# Patient Record
Sex: Female | Born: 1985 | Race: White | Hispanic: No | Marital: Married | Smoking: Never smoker
Health system: Southern US, Community
[De-identification: ages and names within clinical notes are randomized; demographics above are authoritative.]

## PROBLEM LIST (undated history)

## (undated) DIAGNOSIS — K219 Gastro-esophageal reflux disease without esophagitis: Secondary | ICD-10-CM

## (undated) DIAGNOSIS — R000 Tachycardia, unspecified: Secondary | ICD-10-CM

## (undated) DIAGNOSIS — G4730 Sleep apnea, unspecified: Secondary | ICD-10-CM

## (undated) DIAGNOSIS — D649 Anemia, unspecified: Secondary | ICD-10-CM

## (undated) DIAGNOSIS — E669 Obesity, unspecified: Secondary | ICD-10-CM

## (undated) DIAGNOSIS — T7840XA Allergy, unspecified, initial encounter: Secondary | ICD-10-CM

## (undated) DIAGNOSIS — F419 Anxiety disorder, unspecified: Secondary | ICD-10-CM

## (undated) HISTORY — DX: Anxiety disorder, unspecified: F41.9

## (undated) HISTORY — DX: Gastro-esophageal reflux disease without esophagitis: K21.9

## (undated) HISTORY — DX: Anemia, unspecified: D64.9

## (undated) HISTORY — DX: Sleep apnea, unspecified: G47.30

## (undated) HISTORY — DX: Allergy, unspecified, initial encounter: T78.40XA

## (undated) HISTORY — DX: Tachycardia, unspecified: R00.0

## (undated) HISTORY — PX: BREAST SURGERY: SHX581

## (undated) HISTORY — DX: Obesity, unspecified: E66.9

---
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
## 2007-08-08 ENCOUNTER — Ambulatory Visit: Payer: Self-pay | Admitting: *Deleted

## 2007-08-11 ENCOUNTER — Encounter (INDEPENDENT_AMBULATORY_CARE_PROVIDER_SITE_OTHER): Payer: Self-pay | Admitting: *Deleted

## 2008-01-19 ENCOUNTER — Encounter: Payer: Self-pay | Admitting: Family Medicine

## 2008-01-19 DIAGNOSIS — J309 Allergic rhinitis, unspecified: Secondary | ICD-10-CM | POA: Insufficient documentation

## 2008-01-19 DIAGNOSIS — N943 Premenstrual tension syndrome: Secondary | ICD-10-CM | POA: Insufficient documentation

## 2008-01-20 ENCOUNTER — Ambulatory Visit: Payer: Self-pay | Admitting: Family Medicine

## 2008-01-20 DIAGNOSIS — R5383 Other fatigue: Secondary | ICD-10-CM

## 2008-01-20 DIAGNOSIS — R5381 Other malaise: Secondary | ICD-10-CM | POA: Insufficient documentation

## 2008-01-22 LAB — CONVERTED CEMR LAB
ALT: 21 units/L (ref 0–35)
HCT: 36.3 % (ref 36.0–46.0)
Hemoglobin: 12.3 g/dL (ref 12.0–15.0)
MCV: 86.6 fL (ref 78.0–100.0)
Monocytes Absolute: 0.6 10*3/uL (ref 0.1–1.0)
Monocytes Relative: 5.4 % (ref 3.0–12.0)
Neutro Abs: 7.3 10*3/uL (ref 1.4–7.7)
RDW: 12.4 % (ref 11.5–14.6)
TSH: 1.17 microintl units/mL (ref 0.35–5.50)
Total Bilirubin: 0.5 mg/dL (ref 0.3–1.2)
Total Protein: 6.9 g/dL (ref 6.0–8.3)

## 2008-04-16 ENCOUNTER — Ambulatory Visit: Payer: Self-pay | Admitting: Family Medicine

## 2008-07-02 ENCOUNTER — Ambulatory Visit: Payer: Self-pay | Admitting: Family Medicine

## 2008-08-18 ENCOUNTER — Ambulatory Visit: Payer: Self-pay | Admitting: Family Medicine

## 2008-08-18 DIAGNOSIS — J069 Acute upper respiratory infection, unspecified: Secondary | ICD-10-CM | POA: Insufficient documentation

## 2008-08-18 LAB — CONVERTED CEMR LAB: Rapid Strep: NEGATIVE

## 2008-10-19 ENCOUNTER — Ambulatory Visit: Payer: Self-pay | Admitting: Family Medicine

## 2008-10-19 DIAGNOSIS — L509 Urticaria, unspecified: Secondary | ICD-10-CM | POA: Insufficient documentation

## 2008-11-04 ENCOUNTER — Encounter (INDEPENDENT_AMBULATORY_CARE_PROVIDER_SITE_OTHER): Payer: Self-pay | Admitting: *Deleted

## 2008-11-04 ENCOUNTER — Ambulatory Visit: Payer: Self-pay | Admitting: Family Medicine

## 2008-11-11 ENCOUNTER — Encounter: Payer: Self-pay | Admitting: Family Medicine

## 2008-12-02 ENCOUNTER — Ambulatory Visit: Payer: Self-pay | Admitting: Family Medicine

## 2008-12-02 DIAGNOSIS — R002 Palpitations: Secondary | ICD-10-CM | POA: Insufficient documentation

## 2008-12-06 ENCOUNTER — Telehealth: Payer: Self-pay | Admitting: Family Medicine

## 2009-08-11 ENCOUNTER — Ambulatory Visit: Payer: Self-pay | Admitting: Family Medicine

## 2009-08-11 ENCOUNTER — Encounter: Payer: Self-pay | Admitting: Family Medicine

## 2011-08-07 ENCOUNTER — Inpatient Hospital Stay: Payer: Self-pay | Admitting: Obstetrics and Gynecology

## 2014-10-26 ENCOUNTER — Inpatient Hospital Stay: Payer: Self-pay

## 2014-10-26 LAB — CBC WITH DIFFERENTIAL/PLATELET
Basophil #: 0.1 10*3/uL (ref 0.0–0.1)
Basophil %: 0.3 %
EOS ABS: 0.1 10*3/uL (ref 0.0–0.7)
EOS PCT: 0.3 %
HCT: 37.7 % (ref 35.0–47.0)
HGB: 12.5 g/dL (ref 12.0–16.0)
Lymphocyte #: 1.7 10*3/uL (ref 1.0–3.6)
Lymphocyte %: 9.8 %
MCH: 28.7 pg (ref 26.0–34.0)
MCHC: 33.1 g/dL (ref 32.0–36.0)
MCV: 87 fL (ref 80–100)
MONO ABS: 0.9 x10 3/mm (ref 0.2–0.9)
Monocyte %: 5.2 %
NEUTROS PCT: 84.4 %
Neutrophil #: 14.3 10*3/uL — ABNORMAL HIGH (ref 1.4–6.5)
PLATELETS: 238 10*3/uL (ref 150–440)
RBC: 4.35 10*6/uL (ref 3.80–5.20)
RDW: 14.1 % (ref 11.5–14.5)
WBC: 16.9 10*3/uL — ABNORMAL HIGH (ref 3.6–11.0)

## 2014-10-27 LAB — HEMATOCRIT: HCT: 34.6 % — AB (ref 35.0–47.0)

## 2015-02-01 NOTE — H&P (Signed)
L&D Evaluation:  History:  HPI Pt is a 29 yo G2P1001 at 40.[redacted] weeks GA with an EDC of 10/23/14 who presents to L&D with reports of contractions that started this am. She reports +FM, denies vb or lof. Her prenatal course has been uneventful. She is O+, RI, VI, GBS negative. She recieved her Tdap and flu vaccine.   Presents with contractions   Patient's Surgical History other  reduction mammoplasty   Medications Pre Natal Vitamins   Allergies latex, nitrile gloves, tree nuts   Social History none   Family History Non-Contributory   ROS:  ROS All systems were reviewed.  HEENT, CNS, GI, GU, Respiratory, CV, Renal and Musculoskeletal systems were found to be normal.   Exam:  Vital Signs stable   General no apparent distress   Mental Status clear   Chest clear   Heart normal sinus rhythm   Abdomen gravid, tender with contractions   Pelvic 5.5/90/-2 upon admission   Mebranes Intact   FHT normal rate with no decels, 130-135, moderate variability, +accels, no decels   Ucx regular, every 2-6   Skin dry, no lesions, no rashes   Lymph no lymphadenopathy   Impression:  Impression active labor, reactive NST, IUP at 40.3   Plan:  Plan EFM/NST, epidural when requested, anticipate svd.   Follow Up Appointment need to schedule. in 6 weeks   Electronic Signatures: Jannet MantisSubudhi, Salome Hautala (CNM)  (Signed 02-Feb-16 14:42)  Authored: L&D Evaluation   Last Updated: 02-Feb-16 14:42 by Jannet MantisSubudhi, Shabria Egley (CNM)

## 2015-09-27 ENCOUNTER — Emergency Department
Admission: EM | Admit: 2015-09-27 | Discharge: 2015-09-27 | Disposition: A | Discharge status: Home / Self Care | Payer: PRIVATE HEALTH INSURANCE | Attending: Emergency Medicine | Admitting: Emergency Medicine

## 2015-09-27 ENCOUNTER — Encounter: Payer: Self-pay | Admitting: Emergency Medicine

## 2015-09-27 ENCOUNTER — Emergency Department: Payer: PRIVATE HEALTH INSURANCE

## 2015-09-27 DIAGNOSIS — M778 Other enthesopathies, not elsewhere classified: Secondary | ICD-10-CM

## 2015-09-27 DIAGNOSIS — M25532 Pain in left wrist: Secondary | ICD-10-CM | POA: Diagnosis present

## 2015-09-27 DIAGNOSIS — M779 Enthesopathy, unspecified: Secondary | ICD-10-CM | POA: Diagnosis not present

## 2015-09-27 MED ORDER — MELOXICAM 15 MG PO TABS: 15.0000 mg | ORAL_TABLET | Freq: Every day | ORAL | Status: DC

## 2015-09-27 NOTE — ED Notes (Signed)
Pt arrived to the ED accompanied by her husband for complainants of left hand pain and inability to move it. Pt reports that she was going to catch her daughter and the hand started to hurd ever since. Pt is AOx4 in moderate pain, teary during triage.

## 2015-09-27 NOTE — ED Provider Notes (Signed)
CSN: [id number]     Arrival date & time 09/27/15  1907 History   First MD Initiated Contact with Patient 09/27/15 2055     Chief Complaint  Patient presents with  . Wrist Pain     (Consider location/radiation/quality/duration/timing/severity/associated sxs/prior Treatment) HPI  30 year old female presents to emergency department for evaluation of left wrist pain. Just prior to arrival, her daughter fell down the steps, she called her daughter and injured her left wrist. She has a history of dequervains tendinitis, received cortisone injection which gave her 2 months worth of relief. Pain is currently moderate, she has had no relief with ibuprofen. She has had relief with meloxicam in the past. She denies any numbness or tingling. Pain is located over the radial styloid and is increased with grasping and gripping and lifting. She denies any other injuries to her body.  History reviewed. No pertinent past medical history. History reviewed. No pertinent past surgical history. History reviewed. No pertinent family history. Social History  Substance Use Topics  . Smoking status: Never Smoker   . Smokeless tobacco: None  . Alcohol Use: No   OB History    Gravida Para Term Preterm AB TAB SAB Ectopic Multiple Living   2 2 2  0 0 0 0 0 0 2     Review of Systems  Constitutional: Negative for fever, chills, activity change and fatigue.  HENT: Negative for congestion, sinus pressure and sore throat.   Eyes: Negative for visual disturbance.  Respiratory: Negative for cough, chest tightness and shortness of breath.   Cardiovascular: Negative for chest pain and leg swelling.  Gastrointestinal: Negative for nausea, vomiting, abdominal pain and diarrhea.  Genitourinary: Negative for dysuria.  Musculoskeletal: Positive for arthralgias. Negative for gait problem.  Skin: Negative for rash.  Neurological: Negative for weakness, numbness and headaches.  Hematological: Negative for adenopathy.   Psychiatric/Behavioral: Negative for behavioral problems, confusion and agitation.      Allergies  Corticosteroids  Home Medications   Prior to Admission medications   Medication Sig Start Date End Date Taking? Authorizing Provider  meloxicam (MOBIC) 15 MG tablet Take 1 tablet (15 mg total) by mouth daily. 09/27/15   Evon Slackhomas C Gaines, PA-C   BP 123/70 mmHg  Pulse 105  Temp(Src) 98.2 F (36.8 C) (Oral)  Resp 18  Ht 5\' 3"  (1.6 m)  Wt 77.111 kg  BMI 30.12 kg/m2  SpO2 99% Physical Exam  Constitutional: She is oriented to person, place, and time. She appears well-developed and well-nourished. No distress.  HENT:  Head: Normocephalic and atraumatic.  Mouth/Throat: Oropharynx is clear and moist.  Eyes: EOM are normal. Pupils are equal, round, and reactive to light. Right eye exhibits no discharge. Left eye exhibits no discharge.  Neck: Normal range of motion. Neck supple.  Cardiovascular: Normal rate and intact distal pulses.   Pulmonary/Chest: Effort normal. No respiratory distress.  Abdominal: Soft. She exhibits no distension. There is no tenderness.  Musculoskeletal: Normal range of motion. She exhibits no edema.  Examination of the left hand and wrist shows patient has tenderness over the radial styloid with positive Finkelstein's test. Sensation is intact. There is no limited range of motion or tendon deficits noted. She is tender over the radial styloid and nontender over the ulnar styloid. Full range of Motion of the wrist.  Neurological: She is alert and oriented to person, place, and time. She has normal reflexes.  Skin: Skin is warm and dry.  Psychiatric: She has a normal mood and affect. Her  behavior is normal. Thought content normal.    ED Course  Procedures (including critical care time) SPLINT APPLICATION Date/Time: 9:31 PM Authorized by: Patience Musca Consent: Verbal consent obtained. Risks and benefits: risks, benefits and alternatives were  discussed Consent given by: patient Splint applied by: Physician Asst. Location details: Left wrist  Splint type: Thumb spica  Supplies used: Prewrap, Ace wrap, Ortho-Glass three-inch  Post-procedure: The splinted body part was neurovascularly unchanged following the procedure. Patient tolerance: Patient tolerated the procedure well with no immediate complications.    Labs Review Labs Reviewed - No data to display  Imaging Review Dg Wrist Complete Left  09/27/2015  CLINICAL DATA:  RIGHT wrist injury. Blunt trauma. Anal radial aspect EXAM: LEFT WRIST - COMPLETE 3+ VIEW COMPARISON:  None. FINDINGS: No distal radius or ulnar fracture. Radiocarpal joint is intact. No carpal fracture. No soft tissue abnormality. IMPRESSION: No fracture or dislocation. Electronically Signed   By: Genevive Bi M.D.   On: 09/27/2015 20:13   I have personally reviewed and evaluated these images and lab results as part of my medical decision-making.   EKG Interpretation None      MDM   Final diagnoses:  Thumb tendonitis    Patient with recurrent left dequervains tenosynovitis. Patient was placed into a left thumb spica splint. She will start meloxicam 15 mg daily for 1 week. She'll follow-up with Dr. Hyacinth Meeker in 1 week if no improvement. Return to the ER for any worsening symptoms urgent changes in her health.    Evon Slack, PA-C 09/27/15 2132  Arnaldo Natal, MD 09/28/15 [phone]

## 2015-09-27 NOTE — Discharge Instructions (Signed)
Tendinitis and Tenosynovitis  °Tendinitis is inflammation of the tendon. Tenosynovitis is inflammation of the lining around the tendon (tendon sheath). These painful conditions often occur at once. Tendons attach muscle to bone. To move a limb, force from the muscle moves through the tendon, to the bone. These conditions often cause increased pain when moving. Tendinitis may be caused by a small or partial tear in the tendon.  °SYMPTOMS  °· Pain, tenderness, redness, bruising, or swelling at the injury. °· Loss of normal joint movement. °· Pain that gets worse with use of the muscle and joint attached to the tendon. °· Weakness in the tendon, caused by calcium build up that may occur with tendinitis. °· Commonly affected tendons: °¨ Achilles tendon (calf of leg). °¨ Rotator cuff (shoulder joint). °¨ Patellar tendon (kneecap to shin). °¨ Peroneal tendon (ankle). °¨ Posterior tibial tendon (inner ankle). °¨ Biceps tendon (in front of shoulder). °CAUSES  °· Sudden strain on a flexed muscle, muscle overuse, sudden increase or change in activity, vigorous activity. °· Result of a direct hit (less common). °· Poor muscle action (biomechanics). °RISK INCREASES WITH: °· Injury (trauma). °· Too much exercise. °· Sudden change in athletic activity. °· Incorrect exercise form or technique. °· Poor strength and flexibility. °· Not warming-up properly before activity. °· Returning to activity before healing is complete. °PREVENTION  °· Warm-up and stretch properly before activity. °· Maintain physical fitness: °¨ Joint flexibility. °¨ Muscle strength and endurance. °¨ Fitness that increases heart rate. °· Learn and use proper exercise techniques. °· Use rehabilitation exercises to strengthen weak muscles and tendons. °· Ice the tendon after activity, to reduce recurring inflammation. °· Wear proper fitting protective equipment for specific tendons, when indicated. °PROGNOSIS  °When treated properly, can be cured in 6 to 8 weeks.  Recovery may take longer, depending on degree of injury.  °RELATED COMPLICATIONS  °· Re-injury or recurring symptoms. °· Permanent weakness or joint stiffness, if injury is severe and recovery is not completed. °· Delayed healing, if sports are started before healing is complete. °· Tearing apart (rupture) of the inflamed tendon. Tendinitis means the tendon is injured and must recover. °TREATMENT  °Treatment first involves ice, medicine, and rest from aggravating activities. This reduces pain and inflammation. Modifying your activity may be considered to prevent recurring injury. A brace, elastic bandage wrap, splint, cast, or sling may be prescribed to protect the joint for a short period. After that period, strengthening and stretching exercise may help to regain strength and full range of motion. If the condition persists, despite non-surgical treatment, surgery may be recommended to remove the inflamed tendon lining. Corticosteroid injections may be given to reduce inflammation. However, these injections may weaken the tendon and increase your risk for tendon rupture. °MEDICATION  °· If pain medicine is needed, nonsteroidal anti-inflammatory medicines (aspirin and ibuprofen), or other minor pain relievers (acetaminophen), are often recommended. °· Do not take pain medicine for 7 days before surgery. °· Prescription pain relievers are usually prescribed only after surgery. Use only as directed and only as much as you need. °· Ointments applied to the skin may be helpful. °· Corticosteroid injections may be given to reduce inflammation. However, this may increase your risk of a tendon rupture. °HEAT AND COLD °· Cold treatment (icing) relieves pain and reduces inflammation. Cold treatment should be applied for 10 to 15 minutes every 2 to 3 hours, and immediately after activity that aggravates your symptoms. Use ice packs or an ice massage. °· Heat   treatment may be used before performing stretching and strengthening  activities prescribed by your caregiver, physical therapist, or athletic trainer. Use a heat pack or a warm water soak. °SEEK MEDICAL CARE IF:  °· Symptoms get worse or do not improve, despite treatment. °· Pain becomes too much to tolerate. °· You develop numbness or tingling. °· Toes become cold, or toenails become blue, gray, or dark colored. °· New, unexplained symptoms develop. (Drugs used in treatment may produce side effects.) °  °This information is not intended to replace advice given to you by your health care provider. Make sure you discuss any questions you have with your health care provider. °  °Document Released: 09/10/2005 Document Revised: 12/03/2011 Document Reviewed: 12/23/2008 °Elsevier Interactive Patient Education ©2016 [employer] ° °

## 2015-09-27 NOTE — ED Notes (Signed)
Pt sts that she injured L wrist today in grabbing motion and because of previous wrist tendon injury, her wrist is "stuck in position". Pt rates pain 6/10.  Pt sts it hurts to move thumb.  Able to move all fingers, sensation normal, <3 cap refill.

## 2016-05-21 ENCOUNTER — Ambulatory Visit (INDEPENDENT_AMBULATORY_CARE_PROVIDER_SITE_OTHER): Payer: PRIVATE HEALTH INSURANCE

## 2016-05-21 ENCOUNTER — Ambulatory Visit (INDEPENDENT_AMBULATORY_CARE_PROVIDER_SITE_OTHER): Payer: PRIVATE HEALTH INSURANCE | Admitting: Podiatry

## 2016-05-21 ENCOUNTER — Encounter: Payer: Self-pay | Admitting: Podiatry

## 2016-05-21 VITALS — BP 102/64 | HR 84 | Resp 12

## 2016-05-21 DIAGNOSIS — M722 Plantar fascial fibromatosis: Secondary | ICD-10-CM | POA: Diagnosis not present

## 2016-05-21 MED ORDER — MELOXICAM 15 MG PO TABS: 15.0000 mg | ORAL_TABLET | Freq: Every day | ORAL | 3 refills | Status: DC

## 2016-05-21 MED ORDER — DICLOFENAC SODIUM 75 MG PO TBEC: 75.0000 mg | DELAYED_RELEASE_TABLET | Freq: Two times a day (BID) | ORAL | 1 refills | Status: DC

## 2016-05-21 MED ORDER — METHYLPREDNISOLONE 4 MG PO TBPK: ORAL_TABLET | ORAL | 0 refills | Status: DC

## 2016-05-21 NOTE — Progress Notes (Signed)
   Subjective:    Patient ID: Vanessa Dalton, female    DOB: 10/18/1985, 30 y.o.   MRN: [id number]  HPI: She presents today with a chief complaint of pain to the plantar aspect of her right heel comes past 2 months. She denies any trauma. States that she's been taking meloxicam regularly with no relief. She relates a horrible reaction to cortisone but is able to take steroids by mouth. She has no problem taking nonsteroidal anti-inflammatories.    Review of Systems  Musculoskeletal: Positive for gait problem.       Objective:   Physical Exam: Objective evaluation demonstrates vital signs are stable alert and oriented 3. Pulses are palpable. Neurologic sensorium is intact per Semmes-Weinstein monofilament. Deep tendon reflexes are intact. Muscle strength is 5 over 5 dorsiflexion plantar flexors and inverters everters on his musculature is intact. Orthopedic evaluation and straight solid joints distal to the ankle for range of motion without crepitation. His pain on palpation medial trochanter below the right heel. A graft taken in the office today demonstrate 3 views osseously mature individual with a plantar distally oriented calcaneal heel spur and a soft tissue increase in density of the plantar fascia calcaneal insertion site indicative of plantar fasciitis. No fractures are identified. Cutaneous evaluation demonstrates supple well-hydrated cutis no erythema edema cellulitis drainage or odor. No open lesions or wounds.      Assessment & Plan:  Plantar fasciitis right foot.  Plan: Discussed etiology pathology conservative versus surgical therapies. I placed her in a plantar fascia brace and a night splint. She is allergic to cortisone so we did not inject her. We discussed appropriate shoe gear stretching exercises ice therapy issue here modifications. She was provided with oral home-going instructions for stretching exercises. I also wrote her prescription for methylprednisolone which she  requested and then declined. And then I wrote her a prescription for diclofenac 75 mg twice daily. I will follow up with her in 1 month. Physical therapy may be necessary that time.

## 2016-05-21 NOTE — Patient Instructions (Signed)

## 2016-06-13 ENCOUNTER — Encounter: Payer: Self-pay | Admitting: Podiatry

## 2016-06-13 ENCOUNTER — Ambulatory Visit (INDEPENDENT_AMBULATORY_CARE_PROVIDER_SITE_OTHER): Payer: PRIVATE HEALTH INSURANCE | Admitting: Podiatry

## 2016-06-13 DIAGNOSIS — M722 Plantar fascial fibromatosis: Secondary | ICD-10-CM | POA: Diagnosis not present

## 2016-06-13 NOTE — Progress Notes (Signed)
She presents today for follow-up of her right heel pain. She states it is really not doing much better. At this point she is ready to have surgery if necessary.  Objective: Vital signs are stable alert and oriented 3. Still has pain on palpation medial calcaneal tubercle of her right heel.  Assessment: Chronic intractable ulnar fascitis right foot.  Plan: I recommended that she start physical therapy. Should this not alleviate her symptoms she will notify us for surgical consideration.

## 2016-08-10 ENCOUNTER — Telehealth: Payer: Self-pay | Admitting: *Deleted

## 2016-08-10 NOTE — Telephone Encounter (Signed)
Pt states plantar fasciitis is not better, is there anything she can get to go into her shoe now. I told pt to begin icing consistently 3-4 times day 15-20 minutes each session with foot protected from the ice, and wear athletic shoes and the brace. Pt states the brace isn't sticking so well now and isn't helping. I offered to transfer pt to schedulers and she said she would call back there are a lot of people out in her office over the holidays.

## 2016-08-22 ENCOUNTER — Ambulatory Visit (INDEPENDENT_AMBULATORY_CARE_PROVIDER_SITE_OTHER): Payer: PRIVATE HEALTH INSURANCE | Admitting: Podiatry

## 2016-08-22 DIAGNOSIS — M722 Plantar fascial fibromatosis: Secondary | ICD-10-CM | POA: Diagnosis not present

## 2016-08-22 MED ORDER — CELECOXIB 200 MG PO CAPS: 200.0000 mg | ORAL_CAPSULE | Freq: Two times a day (BID) | ORAL | 3 refills | Status: DC

## 2016-08-22 NOTE — Progress Notes (Signed)
She presents today states that I think the plantar fasciitis is starting to flare up again. She states that the pain is been present for the past 2-3 weeks and is progressively getting worse. She states that she uses ice and stretching. She also uses a brace all with no relief.. She failed to get a physical therapy after our last visit.  Objective: Vital signs are stable she is alert pain at 3 pulses are palpable. She has moderate severe pain on palpation medial continue to go the right foot. Pulses remain strongly palpable pain.  Assessment: Plantar fasciitis right.  Plan: I encouraged her to go to physical therapy however this point she is requesting a boot because her boot at home makes her foot feel better. She's referred to the night splint. So at this point I will prescribe Celebrex 200 mg 1 by mouth twice a day and I also placed her in a short Cam Walker.

## 2016-09-15 ENCOUNTER — Emergency Department
Admission: EM | Admit: 2016-09-15 | Discharge: 2016-09-16 | Disposition: A | Discharge status: Home / Self Care | Payer: PRIVATE HEALTH INSURANCE | Attending: Emergency Medicine | Admitting: Emergency Medicine

## 2016-09-15 ENCOUNTER — Encounter: Payer: Self-pay | Admitting: Emergency Medicine

## 2016-09-15 ENCOUNTER — Emergency Department: Payer: PRIVATE HEALTH INSURANCE

## 2016-09-15 DIAGNOSIS — R000 Tachycardia, unspecified: Secondary | ICD-10-CM | POA: Diagnosis not present

## 2016-09-15 DIAGNOSIS — R0602 Shortness of breath: Secondary | ICD-10-CM | POA: Diagnosis not present

## 2016-09-15 DIAGNOSIS — R0789 Other chest pain: Secondary | ICD-10-CM | POA: Insufficient documentation

## 2016-09-15 DIAGNOSIS — Z791 Long term (current) use of non-steroidal anti-inflammatories (NSAID): Secondary | ICD-10-CM | POA: Insufficient documentation

## 2016-09-15 LAB — BASIC METABOLIC PANEL
Anion gap: 8 (ref 5–15)
BUN: 15 mg/dL (ref 6–20)
CHLORIDE: 106 mmol/L (ref 101–111)
CO2: 26 mmol/L (ref 22–32)
Calcium: 9.5 mg/dL (ref 8.9–10.3)
Creatinine, Ser: 0.78 mg/dL (ref 0.44–1.00)
GFR calc Af Amer: >60 mL/min (ref >60)
GFR calc non Af Amer: >60 mL/min (ref >60)
GLUCOSE: 123 mg/dL — AB (ref 65–99)
POTASSIUM: 3.3 mmol/L — AB (ref 3.5–5.1)
Sodium: 140 mmol/L (ref 135–145)

## 2016-09-15 LAB — URINALYSIS, COMPLETE (UACMP) WITH MICROSCOPIC
Bacteria, UA: NONE SEEN
Bilirubin Urine: NEGATIVE
GLUCOSE, UA: NEGATIVE mg/dL
Hgb urine dipstick: NEGATIVE
KETONES UR: NEGATIVE mg/dL
Nitrite: NEGATIVE
PROTEIN: NEGATIVE mg/dL
Specific Gravity, Urine: 1.012 (ref 1.005–1.030)
pH: 7 (ref 5.0–8.0)

## 2016-09-15 LAB — CBC
HEMATOCRIT: 37 % (ref 35.0–47.0)
Hemoglobin: 12.7 g/dL (ref 12.0–16.0)
MCH: 29 pg (ref 26.0–34.0)
MCHC: 34.2 g/dL (ref 32.0–36.0)
MCV: 84.9 fL (ref 80.0–100.0)
Platelets: 334 10*3/uL (ref 150–440)
RBC: 4.36 MIL/uL (ref 3.80–5.20)
RDW: 13.1 % (ref 11.5–14.5)
WBC: 12 10*3/uL — ABNORMAL HIGH (ref 3.6–11.0)

## 2016-09-15 LAB — TROPONIN I: Troponin I: <0.03 ng/mL (ref <0.03)

## 2016-09-15 LAB — POCT PREGNANCY, URINE: Preg Test, Ur: NEGATIVE

## 2016-09-15 LAB — TSH: TSH: 3.55 u[IU]/mL (ref 0.350–4.500)

## 2016-09-15 MED ORDER — SODIUM CHLORIDE 0.9 % IV BOLUS (SEPSIS)
1000.0000 mL | Freq: Once | INTRAVENOUS | Status: AC
  Administered 2016-09-16: 1000 mL via INTRAVENOUS

## 2016-09-15 MED ORDER — SODIUM CHLORIDE 0.9 % IV BOLUS (SEPSIS)
1000.0000 mL | Freq: Once | INTRAVENOUS | Status: AC
  Administered 2016-09-15: 1000 mL via INTRAVENOUS

## 2016-09-15 NOTE — Discharge Instructions (Signed)
Please seek medical attention for any high fevers, chest pain, shortness of breath, change in behavior, persistent vomiting, bloody stool or any other new or concerning symptoms.  

## 2016-09-15 NOTE — ED Triage Notes (Signed)
Pt to triage in wheelchair, reports fast heart rate today, up to 200, no hx of abnormal heart rhythm, reports stomach pain and nausea when HR fast.  Pt HR 109 in triage

## 2016-09-15 NOTE — ED Provider Notes (Signed)
[facility] Emergency Department Provider Note   ____________________________________________   I have reviewed the triage vital signs and the nursing notes.   HISTORY  Chief Complaint Tachycardia   History limited by: Not Limited   HPI Vanessa Dalton is a 30 y.o. female who presents to the emergency department today because of concerns for fast heart rate. The patient states that the symptoms started tonight. The patient felt a slight pressure in her chest. She then felt like her heart started racing. She states she checked her heart rate on any home pulse ox and noted it was in the 200s.  The patient had one episode roughly 1 week ago where she felt like her heart was racing. Otherwise no similar symptoms in the past. Was recently treated for sinus infection however stopped treatment for that roughly 1 week ago.    History reviewed. No pertinent past medical history.  Patient Active Problem List   Diagnosis Date Noted  . PALPITATIONS 12/02/2008  . URTICARIA 10/19/2008  . URI 08/18/2008  . FATIGUE 01/20/2008  . ALLERGIC RHINITIS 01/19/2008  . MIGRAINE, MENSTRUAL 01/19/2008    History reviewed. No pertinent surgical history.  Prior to Admission medications   Medication Sig Start Date End Date Taking? Authorizing Provider  celecoxib (CELEBREX) 200 MG capsule Take 1 capsule (200 mg total) by mouth 2 (two) times daily. 08/22/16   Max T Hyatt, DPM  diclofenac (VOLTAREN) 75 MG EC tablet Take 1 tablet (75 mg total) by mouth 2 (two) times daily. 05/21/16   Max T Hyatt, DPM    Allergies Corticosteroids  History reviewed. No pertinent family history.  Social History Social History  Substance Use Topics  . Smoking status: Never Smoker  . Smokeless tobacco: Never Used  . Alcohol use No    Review of Systems  Constitutional: Negative for fever. Cardiovascular: Positive for chest pressure. Respiratory: Positive for shortness of  breath. Gastrointestinal: Negative for abdominal pain, vomiting and diarrhea. Genitourinary: Negative for dysuria. Musculoskeletal: Negative for back pain. Skin: Negative for rash. Neurological: Negative for headaches, focal weakness or numbness.  10-point ROS otherwise negative.  ____________________________________________   PHYSICAL EXAM:  VITAL SIGNS: ED Triage Vitals  Enc Vitals Group     BP 09/15/16 2045 129/83     Pulse Rate 09/15/16 2045 (!) 109     Resp 09/15/16 2045 20     Temp 09/15/16 2045 98.3 F (36.8 C)     Temp Source 09/15/16 2045 Oral     SpO2 09/15/16 2045 100 %     Weight 09/15/16 2046 180 lb (81.6 kg)     Height 09/15/16 2046 5\' 3"  (1.6 m)     Head Circumference --      Peak Flow --      Pain Score 09/15/16 2046 0    Constitutional: Alert and oriented. Well appearing and in no distress. Eyes: Conjunctivae are normal. Normal extraocular movements. ENT   Head: Normocephalic and atraumatic.   Nose: No congestion/rhinnorhea.   Mouth/Throat: Mucous membranes are moist.   Neck: No stridor. Hematological/Lymphatic/Immunilogical: No cervical lymphadenopathy. Cardiovascular: Tachycardic, regular rhythm.  No murmurs, rubs, or gallops.  Respiratory: Normal respiratory effort without tachypnea nor retractions. Breath sounds are clear and equal bilaterally. No wheezes/rales/rhonchi. Gastrointestinal: Soft and non tender. No rebound. No guarding.  Genitourinary: Deferred Musculoskeletal: Normal range of motion in all extremities. No lower extremity edema. Neurologic:  Normal speech and language. No gross focal neurologic deficits are appreciated.  Skin:  Skin is  warm, dry and intact. No rash noted. Psychiatric: Mood and affect are normal. Speech and behavior are normal. Patient exhibits appropriate insight and judgment.  ____________________________________________    LABS (pertinent positives/negatives)  Labs Reviewed  BASIC METABOLIC PANEL -  Abnormal; Notable for the following:       Result Value   Potassium 3.3 (*)    Glucose, Bld 123 (*)    All other components within normal limits  CBC - Abnormal; Notable for the following:    WBC 12.0 (*)    All other components within normal limits  URINALYSIS, COMPLETE (UACMP) WITH MICROSCOPIC - Abnormal; Notable for the following:    Color, Urine YELLOW (*)    APPearance HAZY (*)    Leukocytes, UA TRACE (*)    Squamous Epithelial / LPF 0-5 (*)    All other components within normal limits  TROPONIN I  TSH  FIBRIN DERIVATIVES D-DIMER ([facility] ONLY)  TROPONIN I  POCT PREGNANCY, URINE   2nd trop and D-dimer pending  ____________________________________________   EKG  Lurline IdolI, Feleica Fulmore, attending physician, personally viewed and interpreted this EKG  EKG Time: 2049 Rate: 131 Rhythm: sinus tachycardia Axis: normal Intervals: qtc 445 QRS: narrow ST changes: no st elevation Impression: abnormal ekg   ____________________________________________    RADIOLOGY  CXR  IMPRESSION: Normal chest.  ____________________________________________   PROCEDURES  Procedures  ____________________________________________   INITIAL IMPRESSION / ASSESSMENT AND PLAN / ED COURSE  Pertinent labs & imaging results that were available during my care of the patient were reviewed by me and considered in my medical decision making (see chart for details).  Patient presents to the emergency department today with concerns for palpitations and tachycardia. Heart rate initially in the 130s. Patient no acute distress. No murmurs appreciated. Initial troponin negative. Patient was given a liter of fluid and her heart rate did improve however she continued to have some chest pressure. Will plan on adding on a d-dimer as well as checking a second troponin.  ____________________________________________   FINAL CLINICAL IMPRESSION(S) / ED DIAGNOSES  Tachycardic Chest pressure  Note: This  dictation was prepared with Dragon dictation. Any transcriptional errors that result from this process are unintentional     [name], MD 09/15/16 [phone]

## 2016-09-15 NOTE — ED Notes (Signed)
Two unsuccessful IV attempts, primary nurse and float nurse notified

## 2016-09-15 NOTE — ED Notes (Signed)
Patient c/o heart palpations/tachycardia and chest pain described as pressure. Patient reports pain radiates neck. Patient c/o SOB, nausea, dizziness/lightheadedness,weakness and SOB.

## 2016-09-16 LAB — TROPONIN I

## 2016-09-16 LAB — FIBRIN DERIVATIVES D-DIMER (ARMC ONLY): Fibrin derivatives D-dimer (ARMC): 130 (ref 0–499)

## 2016-09-16 MED ORDER — ACETAMINOPHEN 325 MG PO TABS
ORAL_TABLET | ORAL | Status: AC
  Administered 2016-09-16: 650 mg via ORAL
  Filled 2016-09-16: qty 2

## 2016-09-16 MED ORDER — ACETAMINOPHEN 325 MG PO TABS
650.0000 mg | ORAL_TABLET | Freq: Once | ORAL | Status: AC
  Administered 2016-09-16: 650 mg via ORAL

## 2016-09-16 NOTE — ED Provider Notes (Signed)
-----------------------------------------   2:51 AM on 09/16/2016 -----------------------------------------   Blood pressure 131/86, pulse 79, temperature 98.3 F (36.8 C), temperature source Oral, resp. rate 16, height 5\' 3"  (1.6 m), weight 180 lb (81.6 kg), SpO2 96 %.  Assuming care from Dr. Derrill KayGoodman.  In short, Vanessa Dalton is a 30 y.o. female with a chief complaint of Tachycardia .  Refer to the original H&P for additional details.  The current plan of care is to follow up the results of the repeat Troponin and Ddimer.  The patient's d-dimer is unremarkable and the troponin which was dated incorrectly was negative. The patient is no longer tachycardic in the emergency department she will be discharged home to follow-up with the acute care clinic for further evaluation of these symptoms.    Rebecka ApleyAllison P Melanny Wire, MD 09/16/16 [phone]

## 2016-09-27 DIAGNOSIS — I4711 Inappropriate sinus tachycardia, so stated: Secondary | ICD-10-CM | POA: Insufficient documentation

## 2016-10-02 ENCOUNTER — Ambulatory Visit: Payer: PRIVATE HEALTH INSURANCE | Admitting: Internal Medicine

## 2016-10-03 ENCOUNTER — Ambulatory Visit: Payer: PRIVATE HEALTH INSURANCE | Admitting: Podiatry

## 2016-10-08 ENCOUNTER — Ambulatory Visit: Payer: PRIVATE HEALTH INSURANCE | Admitting: Podiatry

## 2016-10-23 DIAGNOSIS — F411 Generalized anxiety disorder: Secondary | ICD-10-CM | POA: Insufficient documentation

## 2016-12-07 ENCOUNTER — Encounter: Payer: Self-pay | Admitting: Obstetrics and Gynecology

## 2016-12-07 ENCOUNTER — Ambulatory Visit (INDEPENDENT_AMBULATORY_CARE_PROVIDER_SITE_OTHER): Payer: PRIVATE HEALTH INSURANCE | Admitting: Obstetrics and Gynecology

## 2016-12-07 VITALS — BP 110/70 | HR 84 | Ht 63.0 in | Wt 199.0 lb

## 2016-12-07 DIAGNOSIS — Z30431 Encounter for routine checking of intrauterine contraceptive device: Secondary | ICD-10-CM | POA: Diagnosis not present

## 2016-12-07 NOTE — Progress Notes (Signed)
    History of Present Illness:  Vanessa BlakesCasey W Pucillo is a 31 y.o. that had a Paragard IUD placed approximately 14 months ago. Since that time, she states  Has done great. She has monthly menses, lasting 4-5 days. She felt a "pop" yesterday and then had subsequent light red bleeding yesterday and this AM. She deneis any unusual pelvic pain, vag sx, fevers. She feels her strings but just wants to check to make sure IUD hasn't moved.    Review of Systems  Constitutional: Negative for fever, malaise/fatigue and weight loss.  Gastrointestinal: Negative for blood in stool, constipation, diarrhea, nausea and vomiting.  Genitourinary: Negative for dysuria, flank pain, frequency, hematuria and urgency.  Musculoskeletal: Negative for back pain.  Skin: Negative for itching and rash.    Physical Exam:  BP 110/70   Pulse 84   Ht 5\' 3"  (1.6 m)   Wt 199 lb (90.3 kg)   LMP 11/22/2016   BMI 35.25 kg/m  Body mass index is 35.25 kg/m.  Pelvic exam:  Two IUD strings present seen coming from the cervical os. EGBUS, vaginal vault and cervix: within normal limits   Assessment:  Routine checking of IUD Encounter for routine checking of intrauterine contraceptive device (IUD) - IUD in place. No bleeding present. Reassurance. F/u for u/s prn unusual sx.   IUD strings present in proper location; pt doing well  Plan: F/u if any signs of infection or can no longer feel the strings.   Johnisha Louks B. Benjamyn Hestand, PA-C 12/07/2016 11:47 AM

## 2017-03-28 ENCOUNTER — Encounter: Payer: Self-pay | Admitting: Obstetrics & Gynecology

## 2017-04-01 ENCOUNTER — Ambulatory Visit (INDEPENDENT_AMBULATORY_CARE_PROVIDER_SITE_OTHER): Payer: PRIVATE HEALTH INSURANCE | Admitting: Obstetrics & Gynecology

## 2017-04-01 ENCOUNTER — Encounter: Payer: Self-pay | Admitting: Obstetrics & Gynecology

## 2017-04-01 VITALS — BP 110/60 | HR 85 | Ht 63.0 in | Wt 210.0 lb

## 2017-04-01 DIAGNOSIS — Z6837 Body mass index (BMI) 37.0-37.9, adult: Secondary | ICD-10-CM

## 2017-04-01 DIAGNOSIS — Z30432 Encounter for removal of intrauterine contraceptive device: Secondary | ICD-10-CM

## 2017-04-01 DIAGNOSIS — R635 Abnormal weight gain: Secondary | ICD-10-CM | POA: Insufficient documentation

## 2017-04-01 DIAGNOSIS — Z01419 Encounter for gynecological examination (general) (routine) without abnormal findings: Secondary | ICD-10-CM

## 2017-04-01 DIAGNOSIS — Z0000 Encounter for general adult medical examination without abnormal findings: Secondary | ICD-10-CM

## 2017-04-01 MED ORDER — PHENTERMINE HCL 37.5 MG PO TABS: 37.5000 mg | ORAL_TABLET | Freq: Every day | ORAL | 1 refills | Status: DC

## 2017-04-01 NOTE — Progress Notes (Signed)
HPI:      [name] is a 31 y.o. [id number] who LMP was Patient's last menstrual period was 03/18/2017., she presents today for her annual examination. The patient has no complaints today. The patient is sexually active. Her last pap: approximate date 2015 and was normal. The patient does perform self breast exams.  There is no notable family history of breast or ovarian cancer in her family.  The patient has regular exercise: yes.  The patient denies current symptoms of depression.   Weight gain 30 lbas this year, uncertain why.  Has anxiety and took Paxil for 3 weeks but has now stopped due to SE.  Desires IUD out as feels it may contribute to anxiety, weight gain.  Husband planning for vasectomy.  GYN History: Contraception: IUD  PMHx: Past Medical History:  Diagnosis Date  . Obesity    History reviewed. No pertinent surgical history. Family History  Problem Relation Age of Onset  . Hypertension Mother   . Atrial fibrillation Mother    Social History  Substance Use Topics  . Smoking status: Never Smoker  . Smokeless tobacco: Never Used  . Alcohol use No    Current Outpatient Prescriptions:  .  PARAGARD INTRAUTERINE COPPER IU, by Intrauterine route., Disp: , Rfl:  .  PARoxetine (PAXIL) 20 MG tablet, Take by mouth., Disp: , Rfl:  .  phentermine (ADIPEX-P) 37.5 MG tablet, Take 1 tablet (37.5 mg total) by mouth daily before breakfast., Disp: 30 tablet, Rfl: 1 .  potassium chloride (K-DUR,KLOR-CON) 10 MEQ tablet, Take by mouth., Disp: , Rfl:  Allergies: Corticosteroids  Review of Systems  Constitutional: Negative for chills, fever and malaise/fatigue.  HENT: Negative for congestion, sinus pain and sore throat.   Eyes: Negative for blurred vision and pain.  Respiratory: Negative for cough and wheezing.   Cardiovascular: Negative for chest pain and leg swelling.  Gastrointestinal: Negative for abdominal pain, constipation, diarrhea, heartburn, nausea and vomiting.    Genitourinary: Negative for dysuria, frequency, hematuria and urgency.  Musculoskeletal: Negative for back pain, joint pain, myalgias and neck pain.  Skin: Negative for itching and rash.  Neurological: Negative for dizziness, tremors and weakness.  Endo/Heme/Allergies: Does not bruise/bleed easily.  Psychiatric/Behavioral: Negative for depression. The patient is not nervous/anxious and does not have insomnia.     Objective: BP 110/60   Pulse 85   Ht 5\' 3"  (1.6 m)   Wt 210 lb (95.3 kg)   LMP 03/18/2017   BMI 37.20 kg/m   Filed Weights   04/01/17 0941  Weight: 210 lb (95.3 kg)   Body mass index is 37.2 kg/m. Physical Exam  Constitutional: She is oriented to person, place, and time. She appears well-developed and well-nourished. No distress.  Genitourinary: Rectum normal, vagina normal and uterus normal. Pelvic exam was performed with patient supine. There is no rash or lesion on the right labia. There is no rash or lesion on the left labia. Vagina exhibits no lesion. No bleeding in the vagina. Right adnexum does not display mass and does not display tenderness. Left adnexum does not display mass and does not display tenderness. Cervix does not exhibit motion tenderness, lesion, friability or polyp.   Uterus is mobile and midaxial. Uterus is not enlarged or exhibiting a mass.  HENT:  Head: Normocephalic and atraumatic. Head is without laceration.  Right Ear: Hearing normal.  Left Ear: Hearing normal.  Nose: No epistaxis.  No foreign bodies.  Mouth/Throat: Uvula is midline, oropharynx is clear and  moist and mucous membranes are normal.  Eyes: Pupils are equal, round, and reactive to light.  Neck: Normal range of motion. Neck supple. No thyromegaly present.  Cardiovascular: Normal rate and regular rhythm.  Exam reveals no gallop and no friction rub.   No murmur heard. Pulmonary/Chest: Effort normal and breath sounds normal. No respiratory distress. She has no wheezes. Right breast  exhibits no mass, no skin change and no tenderness. Left breast exhibits no mass, no skin change and no tenderness.  Abdominal: Soft. Bowel sounds are normal. She exhibits no distension. There is no tenderness. There is no rebound.  Musculoskeletal: Normal range of motion.  Neurological: She is alert and oriented to person, place, and time. No cranial nerve deficit.  Skin: Skin is warm and dry.  Psychiatric: She has a normal mood and affect. Judgment normal.  Vitals reviewed.   Pelvic exam:  Two IUD strings present seen coming from the cervical os. EGBUS, vaginal vault and cervix: within normal limits  IUD Removal Strings of IUD identified and grasped.  IUD removed without problem.  Pt tolerated this well.  IUD noted to be intact.  Assessment:  ANNUAL EXAM 1. Annual physical exam   2. Weight gain, abnormal   3. BMI 37.0-37.9, adult   4. Awaiting removal of contraceptive intrauterine device (IUD)    Screening Plan:            1.  Cervical Screening-  Pap smear done today  2. Breast screening- Exam annually and mammogram>40 planned   3. Colonoscopy every 10 years, Hemoccult testing - after age 31  4. Labs managed by PCP  5. Counseling for contraception: Plans vasec soon; condoms discussed  Other:  1. Annual physical exam - IGP, rfx Aptima HPV ASCU  2. Weight gain, abnormal - Phentermine discussed as medicinal option along w diet changes and exercise for weight loss.  Has used medicine in past.  Pros and cons discussed.  3. BMI 37.0-37.9, adult  4. Awaiting removal of contraceptive intrauterine device (IUD) Removed today      F/U  Return in about 2 months (around 06/02/2017) for Follow up.  Annamarie Major, MD, [facility], [facility] 04/01/2017  10:02 AM

## 2017-04-01 NOTE — Patient Instructions (Signed)

## 2017-04-02 LAB — IGP, RFX APTIMA HPV ASCU: PAP SMEAR COMMENT: 0

## 2017-06-03 ENCOUNTER — Ambulatory Visit: Payer: PRIVATE HEALTH INSURANCE | Admitting: Obstetrics & Gynecology

## 2018-04-28 ENCOUNTER — Other Ambulatory Visit: Payer: Self-pay | Admitting: Internal Medicine

## 2018-04-28 DIAGNOSIS — R4189 Other symptoms and signs involving cognitive functions and awareness: Secondary | ICD-10-CM

## 2018-05-12 ENCOUNTER — Ambulatory Visit
Admission: RE | Admit: 2018-05-12 | Discharge: 2018-05-12 | Disposition: A | Discharge status: Home / Self Care | Payer: PRIVATE HEALTH INSURANCE | Source: Ambulatory Visit | Attending: Internal Medicine | Admitting: Internal Medicine

## 2018-05-12 ENCOUNTER — Other Ambulatory Visit: Payer: Self-pay | Admitting: Neurology

## 2018-05-12 DIAGNOSIS — R4189 Other symptoms and signs involving cognitive functions and awareness: Secondary | ICD-10-CM | POA: Diagnosis not present

## 2018-05-12 MED ORDER — GADOBENATE DIMEGLUMINE 529 MG/ML IV SOLN
20.0000 mL | Freq: Once | INTRAVENOUS | Status: AC | PRN
  Administered 2018-05-12: 20 mL via INTRAVENOUS

## 2018-10-24 ENCOUNTER — Telehealth: Payer: Self-pay

## 2018-10-24 NOTE — Telephone Encounter (Signed)
Pt is fairly sure she has a UTI.  Would like rx called in.  [phone]  Left detailed msg needs to be seen and is overdue for annual. If needs to be seen today to go to Urgent Care or [facility] as we have no openings.

## 2019-03-27 IMAGING — MR MR HEAD WO/W CM
12 series · 47 of 48 positions shown · IV contrast (MULTIHANCE)
Comparison: None.

CLINICAL DATA: Migraine headache. Speech disturbance. Hyper
reflexive .

EXAM:
MRI HEAD WITHOUT AND WITH CONTRAST
TECHNIQUE: Multiplanar, multiecho pulse sequences of the brain and surrounding
structures were obtained without and with intravenous contrast.
CONTRAST:  20mL MULTIHANCE GADOBENATE DIMEGLUMINE 529 MG/ML IV SOLN

[Series 5: ax dwi_tracew · axial · 3.0mm · 0.73mm/px · z∈[-75,+87]mm · 3 of 55 slices shown]
[im 1/55]
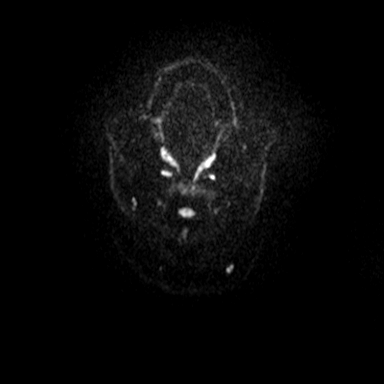
[im 28/55]
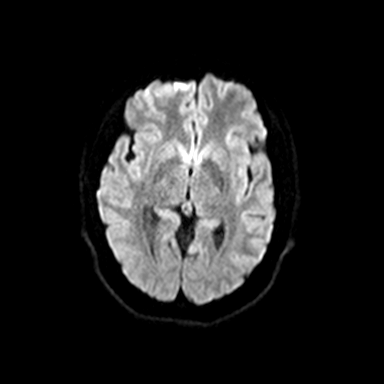
[im 55/55]
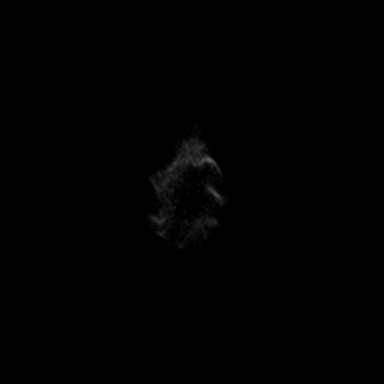

[Series 6: ax dwi_adc · axial · 3.0mm · 0.73mm/px · z∈[-75,+87]mm · 3 of 55 slices shown]
[im 1/55]
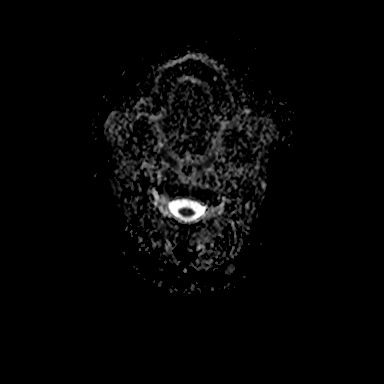
[im 28/55]
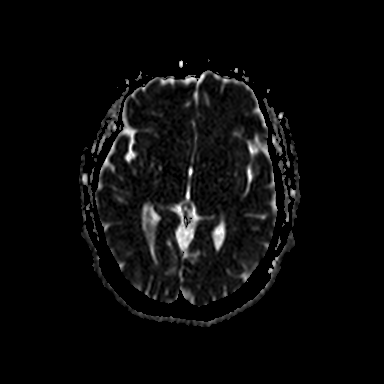
[im 55/55]
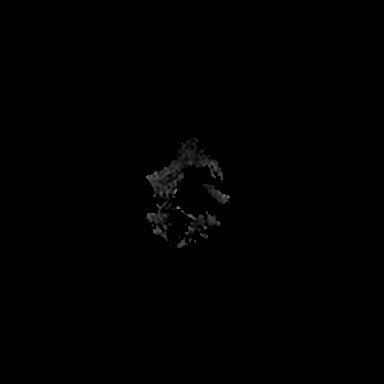

[Series 7: cor dwi_tracew · coronal · 5.0mm · 0.60mm/px · 2 of 39 slices shown]
[im 1/39]
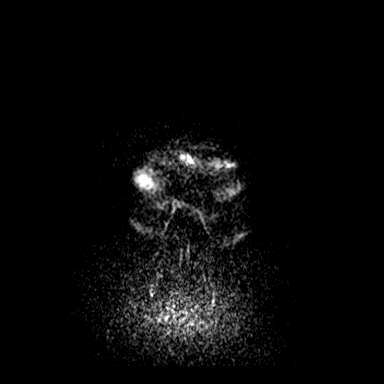
[im 39/39]
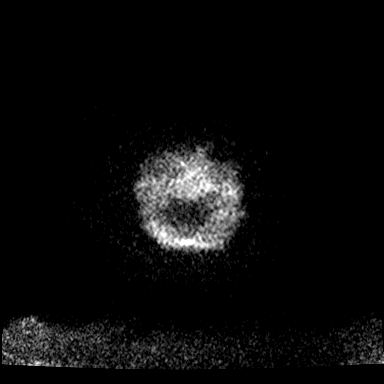

[Series 8: cor dwi_adc · coronal · 5.0mm · 0.60mm/px · 2 of 39 slices shown]
[im 1/39]
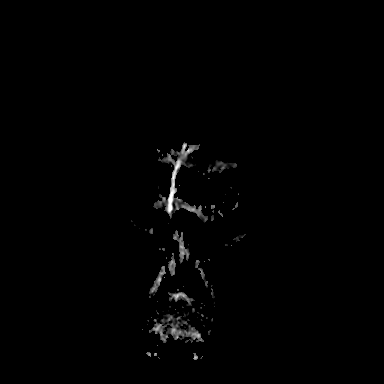
[im 39/39]
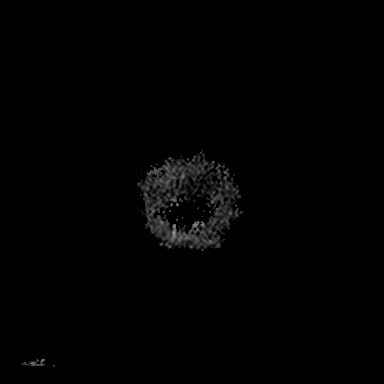

[Series 9: T1 · sagittal · 5.0mm · 0.62mm/px · 2 of 23 slices shown (1 of 2)]
[im 1/23]
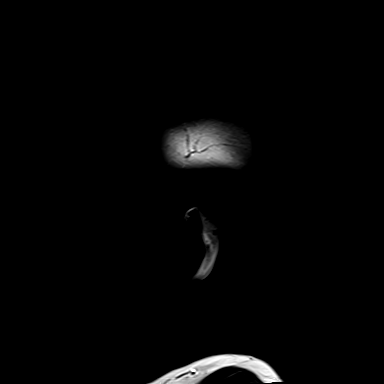
[im 23/23]
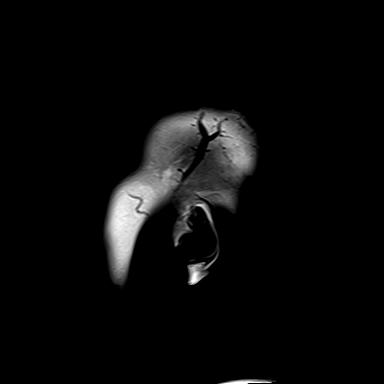

[Series 10: T2 · axial · 5.0mm · 0.53mm/px · z∈[-81,+79]mm · 2 of 28 slices shown]
[im 1/28]
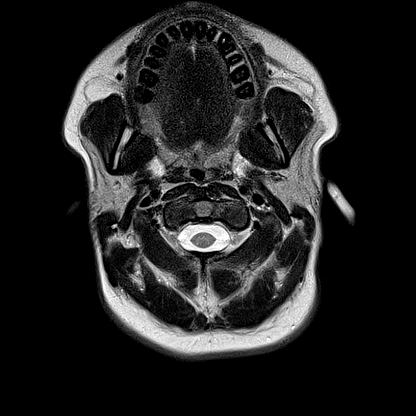
[im 28/28]
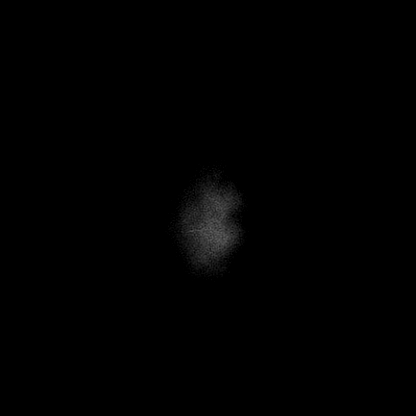

[Series 11: swi_images · axial · 3.0mm · 0.90mm/px · z∈[-84,+80]mm · 4 of 56 slices shown]
[im 1/56]
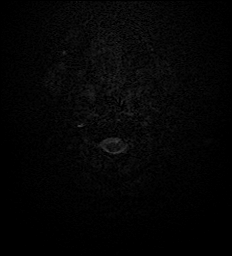
[im 19/56]
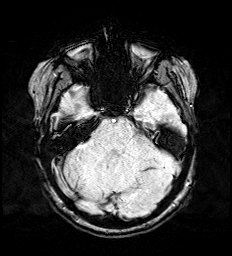
[im 37/56]
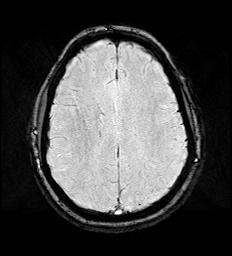
[im 56/56]
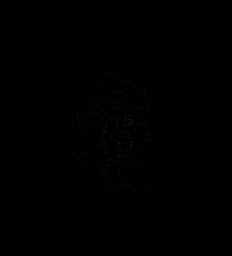

[Series 13: FLAIR · axial · 3.0mm · 0.53mm/px · z∈[-81,+79]mm · 4 of 55 slices shown]
[im 1/55]
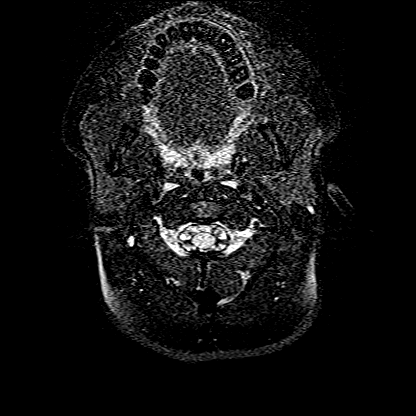
[im 19/55]
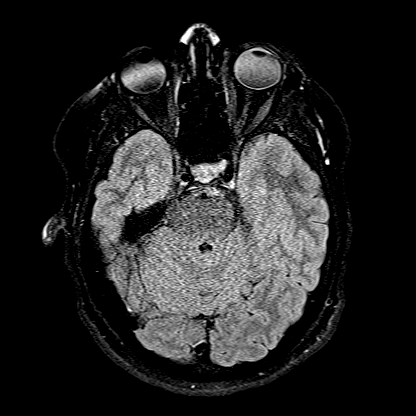
[im 37/55]
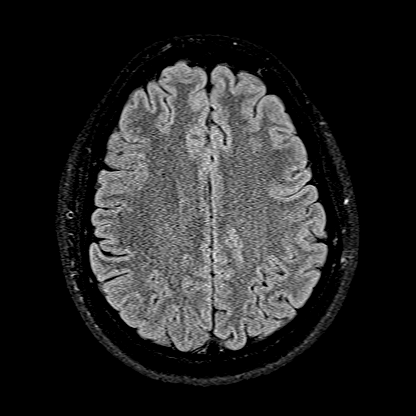
[im 55/55]
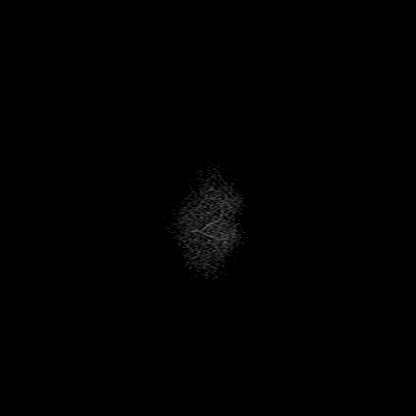

[Series 14: T1 · axial · 1.0mm · 0.98mm/px · z∈[-81,+76]mm · 10 of 160 slices shown (2 of 2)]
[im 1/160]
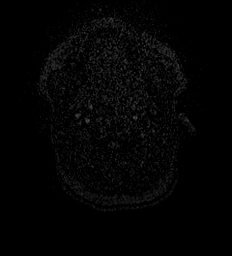
[im 16/160]
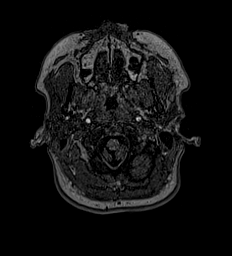
[im 32/160]
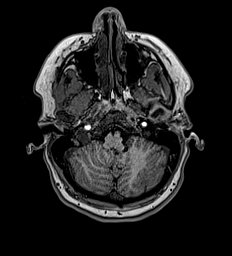
[im 48/160]
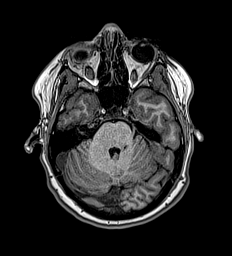
[im 64/160]
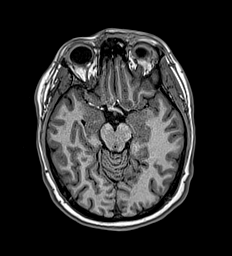
[im 80/160]
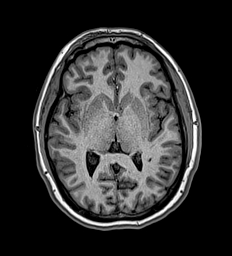
[im 96/160]
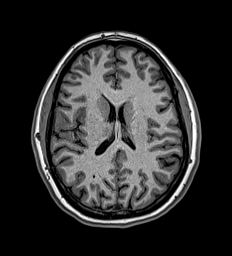
[im 112/160]
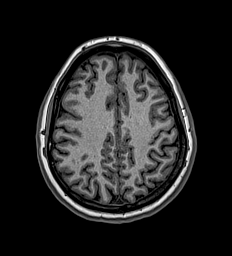
[im 128/160]
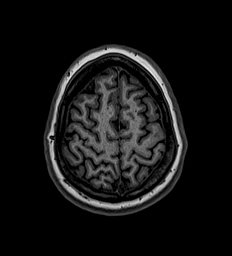
[im 160/160]
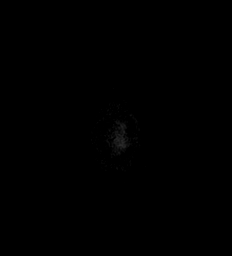

[Series 15: T2 post-contrast · coronal · 5.0mm · 0.57mm/px · 2 of 29 slices shown]
[im 1/29]
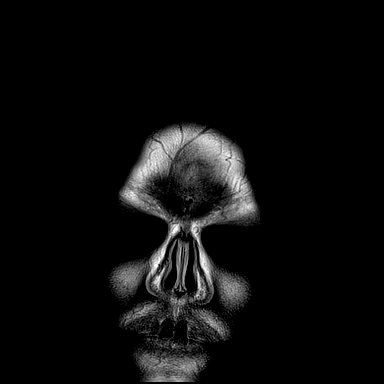
[im 29/29]
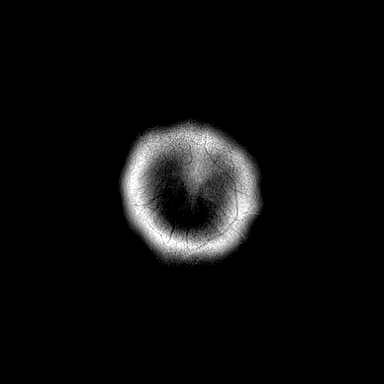

[Series 16: T1 post-contrast · axial · 1.0mm · 0.98mm/px · z∈[-81,+76]mm · 11 of 160 slices shown (1 of 2)]
[im 1/160]
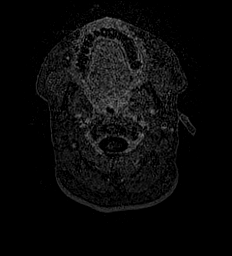
[im 16/160]
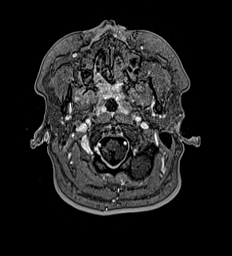
[im 32/160]
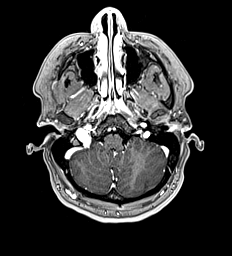
[im 48/160]
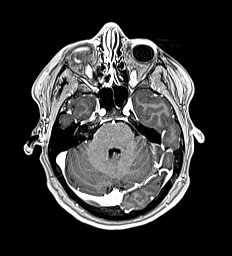
[im 64/160]
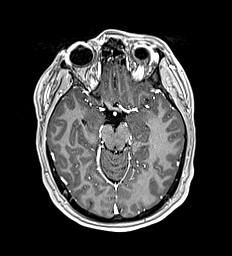
[im 80/160]
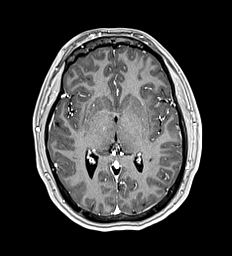
[im 96/160]
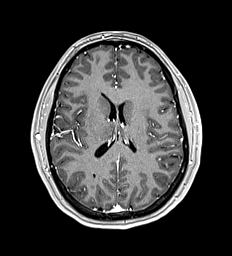
[im 112/160]
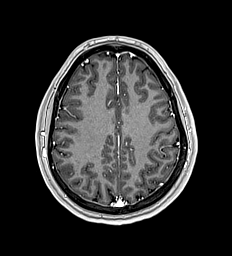
[im 128/160]
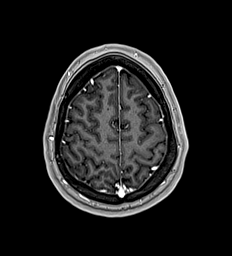
[im 144/160]
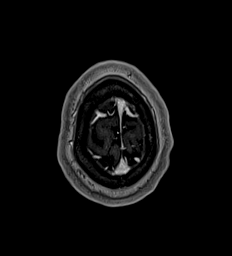
[im 160/160]
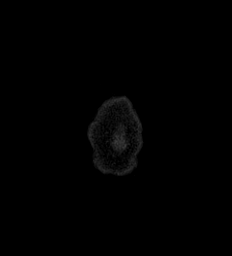

[Series 17: T1 post-contrast · coronal · 5.0mm · 0.57mm/px · 2 of 29 slices shown (2 of 2)]
[im 1/29]
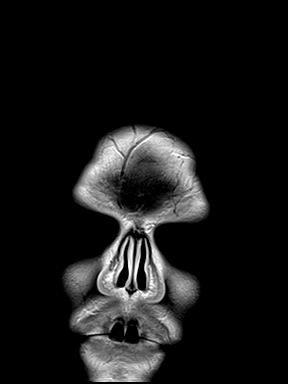
[im 29/29]
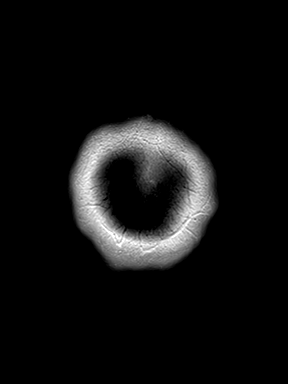

[47 of 48 positions shown; findings below may reference images not displayed]

FINDINGS: Brain: Brain has normal appearance without evidence of malformation,
atrophy, old or acute small or large vessel infarction, mass lesion,
hemorrhage, hydrocephalus or extra-axial collection. Cerebellar
tonsils extend 2 mm through the foramen magnum, upper limits of
normal. After contrast administration, no abnormal enhancement of
the brain or leptomeninges occurs.

Vascular: Major vessels at the base of the brain show flow. Venous
sinuses appear patent.

Skull and upper cervical spine: Normal.

Sinuses/Orbits: Clear/normal.

Other: None significant.
IMPRESSION: Normal exam. No abnormality seen to explain the clinical
presentation.

## 2019-05-11 NOTE — Progress Notes (Signed)
Vanessa Dalton, [name], MD   Chief Complaint  Patient presents with  . Breast exam    left breast, painful at all times, pain starts at nipple and radiates to underarm since Friday, not swollen/hard/discharge  . Pelvic Pain    both sides during ovulation only    HPI:      [name] is a 33 y.o. [id number] who LMP was Patient's last menstrual period was 04/30/2019 (exact date)., presents today for several issues. Pt with LT breast sharp pains for 5 days. Started around nipple, now radiates to LT axilla, hurts with arm movement or fabric touching skin. No erythema, masses, trauma, nipple d/c. Tried tylenol without sx relief.  Drinks 1 cup caffeine daily. No FH breast/ovar cancer. Never had mammo.   Pt also with BLQ pain, R>L with ovulation for the past 6 months. Pain is sharp, feels swollen, lasting 1-2 days. Sx improved with ibup/heating pad. No bleeding, GI, vag, urin sx with pain. No LBP, fevers. Pt is sex active, no new partners, using condoms. No pain/bleeding with sex.   Menses are monthly, lasting 2 days, light to mod flow. Has 3 days spotting before bleeding starts (about wk 3 of cycle). No dysmen.  Had neg pap 04/01/17 (HPV not done due age), IUD removed 7/18  Past Medical History:  Diagnosis Date  . Obesity     History reviewed. No pertinent surgical history.  Family History  Problem Relation Age of Onset  . Hypertension Mother   . Atrial fibrillation Mother     Social History   Socioeconomic History  . Marital status: Married    Spouse name: Not on file  . Number of children: Not on file  . Years of education: Not on file  . Highest education level: Not on file  Occupational History  . Not on file  Social Needs  . Financial resource strain: Not on file  . Food insecurity    Worry: Not on file    Inability: Not on file  . Transportation needs    Medical: Not on file    Non-medical: Not on file  Tobacco Use  . Smoking status: Never Smoker  . Smokeless  tobacco: Never Used  Substance and Sexual Activity  . Alcohol use: No  . Drug use: No  . Sexual activity: Yes    Birth control/protection: None  Lifestyle  . Physical activity    Days per week: Not on file    Minutes per session: Not on file  . Stress: Not on file  Relationships  . Social Musicianconnections    Talks on phone: Not on file    Gets together: Not on file    Attends religious service: Not on file    Active member of club or organization: Not on file    Attends meetings of clubs or organizations: Not on file    Relationship status: Not on file  . Intimate partner violence    Fear of current or ex partner: Not on file    Emotionally abused: Not on file    Physically abused: Not on file    Forced sexual activity: Not on file  Other Topics Concern  . Not on file  Social History Narrative  . Not on file    Outpatient Medications Prior to Visit  Medication Sig Dispense Refill  . escitalopram (LEXAPRO) 10 MG tablet TAKE 1 TABLET BY MOUTH EVERY DAY    . montelukast (SINGULAIR) 10 MG tablet Take by mouth.    .Marland Kitchen  ondansetron (ZOFRAN) 4 MG tablet Take 4 mg by mouth every 8 (eight) hours as needed. for nausea    . SUMAtriptan (IMITREX) 100 MG tablet Take 100 mg by mouth every 2 (two) hours as needed for migraine. May repeat in 2 hours if headache persists or recurs.    Marland Kitchen PARAGARD INTRAUTERINE COPPER IU by Intrauterine route.    Marland Kitchen PARoxetine (PAXIL) 20 MG tablet Take by mouth.    . phentermine (ADIPEX-P) 37.5 MG tablet Take 1 tablet (37.5 mg total) by mouth daily before breakfast. 30 tablet 1  . potassium chloride (K-DUR,KLOR-CON) 10 MEQ tablet Take by mouth.     No facility-administered medications prior to visit.       ROS:  Review of Systems  Constitutional: Positive for fatigue. Negative for fever and unexpected weight change.  Respiratory: Negative for cough, shortness of breath and wheezing.   Cardiovascular: Negative for chest pain, palpitations and leg swelling.   Gastrointestinal: Negative for blood in stool, constipation, diarrhea, nausea and vomiting.  Endocrine: Negative for cold intolerance, heat intolerance and polyuria.  Genitourinary: Positive for pelvic pain. Negative for dyspareunia, dysuria, flank pain, frequency, genital sores, hematuria, menstrual problem, urgency, vaginal bleeding, vaginal discharge and vaginal pain.  Musculoskeletal: Negative for back pain, joint swelling and myalgias.  Skin: Negative for rash.  Neurological: Negative for dizziness, syncope, light-headedness, numbness and headaches.  Hematological: Negative for adenopathy.  Psychiatric/Behavioral: Positive for agitation. Negative for confusion, sleep disturbance and suicidal ideas. The patient is not nervous/anxious.   BREAST: tenderness   OBJECTIVE:   Vitals:  BP 118/70   Ht 5\' 3"  (1.6 m)   Wt 210 lb (95.3 kg)   LMP 04/30/2019 (Exact Date)   BMI 37.20 kg/m   Physical Exam Vitals signs reviewed.  Constitutional:      Appearance: She is well-developed.  Neck:     Musculoskeletal: Normal range of motion.  Pulmonary:     Effort: Pulmonary effort is normal.  Chest:     Breasts: Breasts are symmetrical.        Right: No inverted nipple, mass, nipple discharge, skin change or tenderness.        Left: Tenderness present. No inverted nipple, mass, nipple discharge or skin change.  Abdominal:     Palpations: Abdomen is soft.     Tenderness: There is abdominal tenderness in the right lower quadrant. There is no guarding or rebound.  Genitourinary:    General: Normal vulva.     Pubic Area: No rash.      Labia:        Right: No rash, tenderness or lesion.        Left: No rash, tenderness or lesion.      Vagina: Normal. No vaginal discharge, erythema or tenderness.     Cervix: Normal.     Uterus: Normal. Not enlarged and not tender.      Adnexa: Left adnexa normal.       Right: Tenderness present. No mass.         Left: No mass or tenderness.     Musculoskeletal: Normal range of motion.  Skin:    General: Skin is warm and dry.  Neurological:     General: No focal deficit present.     Mental Status: She is alert and oriented to person, place, and time.     Cranial Nerves: No cranial nerve deficit.  Psychiatric:        Mood and Affect: Mood normal.  Behavior: Behavior normal.        Thought Content: Thought content normal.        Judgment: Judgment normal.     Results:  ULTRASOUND REPORT  Location: [facility]  Date of Service: 05/12/2019    Indications:Pelvic Pain Findings:  The uterus is anteverted and measures 10.0 x 5.9 x 4.7 cm. Echo texture is homogenous without evidence of focal masses. The Endometrium measures 8.4 mm.  Right Ovary measures 2.8 x 2.1 x 2.0 cm. It is normal in appearance. Left Ovary measures 3.6 x 2.0 x 2.1 cm. It is normal in appearance. Survey of the adnexa demonstrates no adnexal masses. There is no free fluid in the cul de sac.  Impression: 1. Normal pelvic ultrasound.   Recommendations: 1.Clinical correlation with the patient's History and Physical Exam.   Deanna ArtisElyse S Fairbanks, RT  Assessment/Plan: Pelvic pain - Plan: US PELVIS TRANSVAGINAL NON-OB (TV ONLY), With ovulation. Neg GYN u/s. Suggestive of mittelschmerz. Discussed BC options vs ibup/heating pad prn sx. Pt prefers NSAIDs at this time. F/u prn.   Mittelschmerz   Breast pain, left - Plan: Neg breast exam. Question hormonal. See if sx improve early next wk after ovulation and d/c caffeine. If sx persist or worsen, will check mammo and u/s. F/u prn.     Return if symptoms worsen or fail to improve.  Tattiana Fakhouri B. Jairus Tonne, PA-C 05/12/2019 2:56 PM

## 2019-05-12 ENCOUNTER — Encounter: Payer: Self-pay | Admitting: Obstetrics and Gynecology

## 2019-05-12 ENCOUNTER — Other Ambulatory Visit: Payer: Self-pay

## 2019-05-12 ENCOUNTER — Ambulatory Visit (INDEPENDENT_AMBULATORY_CARE_PROVIDER_SITE_OTHER): Payer: PRIVATE HEALTH INSURANCE

## 2019-05-12 ENCOUNTER — Ambulatory Visit (INDEPENDENT_AMBULATORY_CARE_PROVIDER_SITE_OTHER): Payer: PRIVATE HEALTH INSURANCE | Admitting: Obstetrics and Gynecology

## 2019-05-12 VITALS — BP 118/70 | Ht 63.0 in | Wt 210.0 lb

## 2019-05-12 DIAGNOSIS — R102 Pelvic and perineal pain: Secondary | ICD-10-CM

## 2019-05-12 DIAGNOSIS — N940 Mittelschmerz: Secondary | ICD-10-CM

## 2019-05-12 DIAGNOSIS — N644 Mastodynia: Secondary | ICD-10-CM

## 2019-05-12 NOTE — Patient Instructions (Signed)
I value your feedback and entrusting us with your care. If you get a [place] [facility] patient survey, I would appreciate you taking the time to let us know about your experience today. Thank you! 

## 2021-03-20 ENCOUNTER — Telehealth: Payer: Self-pay

## 2021-03-20 NOTE — Telephone Encounter (Signed)
Pt calling; has soreness in ovary area; Saturday she felt a 'pop' then the pain started; fever was highest at 101; is this normal, wait it out, or be seen?  [phone]  Fever is down to normal now since 5am.  Adv to be seen as we haven't seen her since 2020; will have front desk call her to schedule.

## 2021-03-21 ENCOUNTER — Encounter: Payer: Self-pay | Admitting: Obstetrics and Gynecology

## 2021-03-21 ENCOUNTER — Ambulatory Visit (INDEPENDENT_AMBULATORY_CARE_PROVIDER_SITE_OTHER): Payer: BC Managed Care – PPO | Admitting: Obstetrics and Gynecology

## 2021-03-21 ENCOUNTER — Other Ambulatory Visit: Payer: Self-pay

## 2021-03-21 VITALS — BP 110/70 | Ht 63.0 in | Wt 233.0 lb

## 2021-03-21 DIAGNOSIS — R102 Pelvic and perineal pain: Secondary | ICD-10-CM | POA: Diagnosis not present

## 2021-03-21 DIAGNOSIS — R509 Fever, unspecified: Secondary | ICD-10-CM | POA: Diagnosis not present

## 2021-03-21 DIAGNOSIS — R1032 Left lower quadrant pain: Secondary | ICD-10-CM

## 2021-03-21 LAB — POCT URINE PREGNANCY: Preg Test, Ur: NEGATIVE

## 2021-03-21 NOTE — Telephone Encounter (Signed)
Patient is scheduled for 03/21/21 with [facility]

## 2021-03-21 NOTE — Progress Notes (Signed)
Marisue Ivan, MD   Chief Complaint  Patient presents with   Pelvic Pain    Entire area since Sunday, had a fever with it Sunday night, denies uti sx    HPI:      Vanessa Dalton is a 35 y.o. [id number] whose LMP was Patient's last menstrual period was 03/05/2021 (exact date)., presents today for pelvic pain for the past 3 days. Pt felt a pop in her belly after bending over and the developed sharp, constant LLQ pains, improved with NSAIDs but persisting. Had temp of 101 that night. Pain became more BLQ the next day, still sharp and constant, with temp of 99.5-100.5. Today, pain is bilat and cramping, not sharp, still improved with NSAIDs. No temp today. No urin, GI, vag sx, no LBP. Pt had neg covid test with fevers. Hx of ovulatory pain with neg GYN u/s 2018 but sx different.  She is sex active, no pain/bleeding. No new partners. No hx of ovar cysts.  Menses are monthly, lasting 2-3 days, light flow. LMP was on time but even lighter than usual. No UPT done.   Past Medical History:  Diagnosis Date   Anxiety    Obesity    Tachycardia     Past Surgical History:  Procedure Laterality Date   BREAST SURGERY     reduction    Family History  Problem Relation Age of Onset   Hypertension Mother    Atrial fibrillation Mother    Colon cancer Maternal Grandmother    Liver cancer Maternal Grandmother    Lung cancer Maternal Grandfather     Social History   Socioeconomic History   Marital status: Married    Spouse name: Not on file   Number of children: Not on file   Years of education: Not on file   Highest education level: Not on file  Occupational History   Not on file  Tobacco Use   Smoking status: Never   Smokeless tobacco: Never  Vaping Use   Vaping Use: Never used  Substance and Sexual Activity   Alcohol use: No   Drug use: No   Sexual activity: Yes    Birth control/protection: None  Other Topics Concern   Not on file  Social History Narrative   Not on  file   Social Determinants of Health   Financial Resource Strain: Not on file  Food Insecurity: Not on file  Transportation Needs: Not on file  Physical Activity: Not on file  Stress: Not on file  Social Connections: Not on file  Intimate Partner Violence: Not on file    Outpatient Medications Prior to Visit  Medication Sig Dispense Refill   buPROPion (WELLBUTRIN XL) 150 MG 24 hr tablet Take 150 mg by mouth every morning.     DULoxetine (CYMBALTA) 60 MG capsule Take 60 mg by mouth daily.     hydrochlorothiazide (MICROZIDE) 12.5 MG capsule Take 12.5 mg by mouth daily.     metoprolol succinate (TOPROL-XL) 50 MG 24 hr tablet Take 50 mg by mouth daily.     montelukast (SINGULAIR) 10 MG tablet Take by mouth.     ondansetron (ZOFRAN) 4 MG tablet Take 4 mg by mouth every 8 (eight) hours as needed. for nausea     SUMAtriptan (IMITREX) 100 MG tablet Take 100 mg by mouth every 2 (two) hours as needed for migraine. May repeat in 2 hours if headache persists or recurs.     topiramate (TOPAMAX) 25 MG tablet Take  25 mg by mouth 2 (two) times daily.     escitalopram (LEXAPRO) 10 MG tablet TAKE 1 TABLET BY MOUTH EVERY DAY     No facility-administered medications prior to visit.    ROS:  Review of Systems  Constitutional:  Positive for fever.  Gastrointestinal:  Negative for blood in stool, constipation, diarrhea, nausea and vomiting.  Genitourinary:  Positive for pelvic pain. Negative for dyspareunia, dysuria, flank pain, frequency, hematuria, urgency, vaginal bleeding, vaginal discharge and vaginal pain.  Musculoskeletal:  Negative for back pain.  Skin:  Negative for rash.  BREAST: No symptoms   OBJECTIVE:   Vitals:  BP 110/70   Ht 5\' 3"  (1.6 m)   Wt 233 lb (105.7 kg)   LMP 03/05/2021 (Exact Date)   BMI 41.27 kg/m   Physical Exam Vitals reviewed.  Constitutional:      Appearance: She is well-developed.  Pulmonary:     Effort: Pulmonary effort is normal.  Abdominal:      Palpations: Abdomen is soft.     Tenderness: There is abdominal tenderness in the left lower quadrant. There is no guarding or rebound.  Genitourinary:    General: Normal vulva.     Pubic Area: No rash.      Labia:        Right: No rash, tenderness or lesion.        Left: No rash, tenderness or lesion.      Vagina: Normal. No vaginal discharge, erythema or tenderness.     Cervix: Normal.     Uterus: Normal. Not enlarged and not tender.      Adnexa: Right adnexa normal.       Right: No mass or tenderness.         Left: Tenderness present. No mass.    Musculoskeletal:        General: Normal range of motion.     Cervical back: Normal range of motion.  Skin:    General: Skin is warm and dry.  Neurological:     General: No focal deficit present.     Mental Status: She is alert and oriented to person, place, and time.  Psychiatric:        Mood and Affect: Mood normal.        Behavior: Behavior normal.        Thought Content: Thought content normal.        Judgment: Judgment normal.    Results: Results for orders placed or performed in visit on 03/21/21 (from the past 24 hour(s))  POCT urine pregnancy     Status: Normal   Collection Time: 03/21/21  4:37 PM  Result Value Ref Range   Preg Test, Ur Negative Negative     Assessment/Plan: Pelvic pain - Plan: 03/23/21 PELVIC COMPLETE WITH TRANSVAGINAL, POCT urine pregnancy; for 3 days, with fevers, more LLQ. Sx improving, no temp today. Slightly tender on exam LLQ, neg UPT. Question etiology since nothing is obvious. Discussed pelvic pain causes. Will check GYN u/s for now. Will call with results. If sx worsen, will sched STAT u/s due to u/s shortage or pt to go to ED for further eval.   LLQ pain - Plan: US PELVIC COMPLETE WITH TRANSVAGINAL, POCT urine pregnancy  Fever, unspecified fever cause--no sx today. Will cont to follow sx.     Return if symptoms worsen or fail to improve.  Vanessa Santelli B. Kaya Klausing, PA-C 03/21/2021 4:40 PM

## 2021-04-21 ENCOUNTER — Ambulatory Visit: Admission: RE | Admit: 2021-04-21 | Payer: BC Managed Care – PPO | Source: Ambulatory Visit

## 2021-08-31 ENCOUNTER — Other Ambulatory Visit: Payer: Self-pay

## 2021-08-31 ENCOUNTER — Encounter: Payer: Self-pay | Admitting: Podiatry

## 2021-08-31 ENCOUNTER — Ambulatory Visit: Payer: BC Managed Care – PPO | Admitting: Podiatry

## 2021-08-31 DIAGNOSIS — L853 Xerosis cutis: Secondary | ICD-10-CM | POA: Diagnosis not present

## 2021-08-31 MED ORDER — AMMONIUM LACTATE 12 % EX LOTN: 1.0000 "application " | TOPICAL_LOTION | CUTANEOUS | 0 refills | Status: DC | PRN

## 2021-08-31 NOTE — Progress Notes (Signed)
Subjective:  Patient ID: Vanessa Dalton, female    DOB: [birth year],  MRN: [id number]  Chief Complaint  Patient presents with   Callouses    Dry cracked heels     35 y.o. female presents with the above complaint.  Patient presents with severe dryness to bilateral heel.  Patient states this painful to touch painful to walk on.  Is cracking and is starting to hurt.  She states been going for years has progressed to gotten worse.  She is tried all the over-the-counter medication she has not tried prescription medication.  She has not seen anyone else prior to seeing me.  She denies any other acute complaints.   Review of Systems: Negative except as noted in the HPI. Denies N/V/F/Ch.  Past Medical History:  Diagnosis Date   Anxiety    Obesity    Tachycardia     Current Outpatient Medications:    ammonium lactate (AMLACTIN) 12 % lotion, Apply 1 application topically as needed for dry skin., Disp: 400 g, Rfl: 0   buPROPion (WELLBUTRIN XL) 150 MG 24 hr tablet, Take 150 mg by mouth every morning., Disp: , Rfl:    DULoxetine (CYMBALTA) 60 MG capsule, Take 60 mg by mouth daily., Disp: , Rfl:    hydrochlorothiazide (MICROZIDE) 12.5 MG capsule, Take 12.5 mg by mouth daily., Disp: , Rfl:    metoprolol succinate (TOPROL-XL) 50 MG 24 hr tablet, Take 50 mg by mouth daily., Disp: , Rfl:    montelukast (SINGULAIR) 10 MG tablet, Take by mouth., Disp: , Rfl:    ondansetron (ZOFRAN) 4 MG tablet, Take 4 mg by mouth every 8 (eight) hours as needed. for nausea, Disp: , Rfl:    SUMAtriptan (IMITREX) 100 MG tablet, Take 100 mg by mouth every 2 (two) hours as needed for migraine. May repeat in 2 hours if headache persists or recurs., Disp: , Rfl:    topiramate (TOPAMAX) 25 MG tablet, Take 25 mg by mouth 2 (two) times daily., Disp: , Rfl:   Social History   Tobacco Use  Smoking Status Never  Smokeless Tobacco Never    Allergies  Allergen Reactions   Corticosteroids Palpitations   Objective:  There were  no vitals filed for this visit. There is no height or weight on file to calculate BMI. Constitutional Well developed. Well nourished.  Vascular Dorsalis pedis pulses palpable bilaterally. Posterior tibial pulses palpable bilaterally. Capillary refill normal to all digits.  No cyanosis or clubbing noted. Pedal hair growth normal.  Neurologic Normal speech. Oriented to person, place, and time. Epicritic sensation to light touch grossly present bilaterally.  Dermatologic Severe dryness noted with fissuring and cracking.  Mild pain on palpation.  No open lesion or wound noted.  Orthopedic: Normal joint ROM without pain or crepitus bilaterally. No visible deformities. No bony tenderness.   Radiographs: None Assessment:   1. Xerosis cutis    Plan:  Patient was evaluated and treated and all questions answered.  Bilateral heel severe xerosis -I explained to the patient the etiology of xerosis and various treatment options were extensively discussed.  I explained to the patient the importance of maintaining moisturization of the skin with application of over-the-counter lotion such as Eucerin or Luciderm.  I given the amount of pain that she is having with skin fissures I believe she will benefit from ammonium lactate.  I have asked her to apply twice a day.  She states understand will do so. -Ammonium lactate was sent to the pharmacy. Marland Kitchen  No follow-ups on file.

## 2021-09-04 ENCOUNTER — Other Ambulatory Visit: Payer: Self-pay | Admitting: Student

## 2021-09-04 DIAGNOSIS — G43119 Migraine with aura, intractable, without status migrainosus: Secondary | ICD-10-CM

## 2021-09-06 ENCOUNTER — Other Ambulatory Visit: Payer: Self-pay | Admitting: Student

## 2021-09-06 DIAGNOSIS — G43119 Migraine with aura, intractable, without status migrainosus: Secondary | ICD-10-CM

## 2021-09-06 DIAGNOSIS — M542 Cervicalgia: Secondary | ICD-10-CM

## 2021-09-26 ENCOUNTER — Ambulatory Visit
Admission: RE | Admit: 2021-09-26 | Discharge: 2021-09-26 | Disposition: A | Discharge status: Home / Self Care | Payer: BC Managed Care – PPO | Source: Ambulatory Visit | Attending: Student | Admitting: Student

## 2021-09-26 ENCOUNTER — Other Ambulatory Visit: Payer: Self-pay

## 2021-09-26 DIAGNOSIS — G43119 Migraine with aura, intractable, without status migrainosus: Secondary | ICD-10-CM

## 2021-09-26 DIAGNOSIS — M542 Cervicalgia: Secondary | ICD-10-CM

## 2021-10-17 DIAGNOSIS — G4733 Obstructive sleep apnea (adult) (pediatric): Secondary | ICD-10-CM | POA: Insufficient documentation

## 2021-10-17 DIAGNOSIS — Z8669 Personal history of other diseases of the nervous system and sense organs: Secondary | ICD-10-CM | POA: Insufficient documentation

## 2021-10-24 ENCOUNTER — Ambulatory Visit: Payer: BC Managed Care – PPO | Admitting: Podiatry

## 2023-03-01 LAB — HM HEPATITIS C SCREENING LAB: HM Hepatitis Screen: NEGATIVE

## 2023-03-08 ENCOUNTER — Ambulatory Visit
Admission: EM | Admit: 2023-03-08 | Discharge: 2023-03-08 | Disposition: A | Discharge status: Home / Self Care | Payer: 59

## 2023-03-08 DIAGNOSIS — J029 Acute pharyngitis, unspecified: Secondary | ICD-10-CM

## 2023-03-08 LAB — POCT RAPID STREP A (OFFICE): Rapid Strep A Screen: NEGATIVE

## 2023-03-08 NOTE — ED Provider Notes (Signed)
Renaldo Fiddler    CSN: [id number] Arrival date & time: 03/08/23  1014      History   Chief Complaint Chief Complaint  Patient presents with   Sore Throat    Entered by patient    HPI Vanessa Dalton is a 37 y.o. female.  Patient presents with 4 day history of sore throat and fever.  Tmax 101.  Treating with Dayquil and Nyquil.  She denies rash, cough, shortness of breath, or other symptoms.  The history is provided by the patient and medical records.    Past Medical History:  Diagnosis Date   Anxiety    Obesity    Tachycardia     Patient Active Problem List   Diagnosis Date Noted   Weight gain, abnormal 04/01/2017   BMI 37.0-37.9, adult 04/01/2017   PALPITATIONS 12/02/2008   URTICARIA 10/19/2008   URI 08/18/2008   FATIGUE 01/20/2008   ALLERGIC RHINITIS 01/19/2008   MIGRAINE, MENSTRUAL 01/19/2008    Past Surgical History:  Procedure Laterality Date   BREAST SURGERY     reduction    OB History     Gravida  2   Para  2   Term  2   Preterm  0   AB  0   Living  2      SAB  0   IAB  0   Ectopic  0   Multiple  0   Live Births  2            Home Medications    Prior to Admission medications   Medication Sig Start Date End Date Taking? Authorizing Provider  AIMOVIG 140 MG/ML SOAJ Inject into the skin. 09/01/21  Yes [provider]  baclofen (LIORESAL) 10 MG tablet Take by mouth. 02/28/23 07/28/23 Yes [provider]  MOUNJARO 15 MG/0.5ML Pen Inject into the skin. 08/30/22  Yes [provider]  rizatriptan (MAXALT-MLT) 10 MG disintegrating tablet DISSOLVE ONE TABLET BY MOUTH AT ONSET OF MIGRAINE AS NEEDED AS DIRECTED 03/05/23  Yes [provider]  ammonium lactate (AMLACTIN) 12 % lotion Apply 1 application topically as needed for dry skin. Patient not taking: Reported on 03/08/2023 08/31/21   Candelaria Stagers, DPM  buPROPion (WELLBUTRIN XL) 150 MG 24 hr tablet Take 150 mg by mouth every morning. 03/21/21    [provider]  DULoxetine (CYMBALTA) 60 MG capsule Take 60 mg by mouth daily. 02/01/21   [provider]  hydrochlorothiazide (MICROZIDE) 12.5 MG capsule Take 12.5 mg by mouth daily. Patient not taking: Reported on 03/08/2023 02/15/21   [provider]  metoprolol succinate (TOPROL-XL) 50 MG 24 hr tablet Take 50 mg by mouth daily. 03/10/21   [provider]  montelukast (SINGULAIR) 10 MG tablet Take by mouth.    [provider]  ondansetron (ZOFRAN) 4 MG tablet Take 4 mg by mouth every 8 (eight) hours as needed. for nausea 03/10/19   [provider]  SUMAtriptan (IMITREX) 100 MG tablet Take 100 mg by mouth every 2 (two) hours as needed for migraine. May repeat in 2 hours if headache persists or recurs. Patient not taking: Reported on 03/08/2023    [provider]  topiramate (TOPAMAX) 25 MG tablet Take 25 mg by mouth 2 (two) times daily. 03/21/21   [provider]    Family History Family History  Problem Relation Age of Onset   Hypertension Mother    Atrial fibrillation Mother    Colon cancer Maternal  Grandmother    Liver cancer Maternal Grandmother    Lung cancer Maternal Grandfather     Social History Social History   Tobacco Use   Smoking status: Never   Smokeless tobacco: Never  Vaping Use   Vaping Use: Never used  Substance Use Topics   Alcohol use: No   Drug use: No     Allergies   Corticosteroids and Doxycycline   Review of Systems Review of Systems  Constitutional:  Positive for fever. Negative for chills.  HENT:  Positive for sore throat. Negative for ear pain.   Respiratory:  Negative for cough and shortness of breath.   Cardiovascular:  Negative for chest pain and palpitations.  Skin:  Negative for rash.     Physical Exam Triage Vital Signs ED Triage Vitals  Enc Vitals Group     BP      Pulse      Resp      Temp      Temp src      SpO2      Weight      Height      Head  Circumference      Peak Flow      Pain Score      Pain Loc      Pain Edu?      Excl. in GC?    No data found.  Updated Vital Signs BP 92/62   Pulse 88   Temp 98.1 F (36.7 C)   Resp 18   LMP 02/14/2023   SpO2 98%   Visual Acuity Right Eye Distance:   Left Eye Distance:   Bilateral Distance:    Right Eye Near:   Left Eye Near:    Bilateral Near:     Physical Exam Vitals and nursing note reviewed.  Constitutional:      General: She is not in acute distress.    Appearance: Normal appearance. She is well-developed. She is not ill-appearing.  HENT:     Right Ear: Tympanic membrane normal.     Left Ear: Tympanic membrane normal.     Nose: Nose normal.     Mouth/Throat:     Mouth: Mucous membranes are moist.     Pharynx: Posterior oropharyngeal erythema present.  Cardiovascular:     Rate and Rhythm: Normal rate and regular rhythm.     Heart sounds: Normal heart sounds.  Pulmonary:     Effort: Pulmonary effort is normal. No respiratory distress.     Breath sounds: Normal breath sounds.  Musculoskeletal:     Cervical back: Neck supple.  Skin:    General: Skin is warm and dry.  Neurological:     Mental Status: She is alert.  Psychiatric:        Mood and Affect: Mood normal.        Behavior: Behavior normal.      UC Treatments / Results  Labs (all labs ordered are listed, but only abnormal results are displayed) Labs Reviewed  POCT RAPID STREP A (OFFICE)    EKG   Radiology No results found.  Procedures Procedures (including critical care time)  Medications Ordered in UC Medications - No data to display  Initial Impression / Assessment and Plan / UC Course  I have reviewed the triage vital signs and the nursing notes.  Pertinent labs & imaging results that were available during my care of the patient were reviewed by me and considered in my medical decision making (see chart for details).  Viral pharyngitis.  Rapid strep negative.  Patient has  been symptomatic for 4 days.  Discussed continued symptomatic care, including Tylenol or ibuprofen as needed.  Education provided on pharyngitis.  Instructed patient to follow up with her PCP if her symptoms are not improving.  She agrees to plan of care.    Final Clinical Impressions(s) / UC Diagnoses   Final diagnoses:  Viral pharyngitis     Discharge Instructions      The strep test is negative.    Follow up with your primary care provider if your symptoms are not improving.        ED Prescriptions   None    PDMP not reviewed this encounter.   Mickie Bail, NP 03/08/23 1053

## 2023-03-08 NOTE — ED Triage Notes (Signed)
Patient to Urgent Care with complaints of sore throat that started on Monday. Fevers x4 days. Painful to swallow.  Taking dayquil/ nyquil.   Daughter sick w/ sore throat negative for strep.

## 2023-03-08 NOTE — Discharge Instructions (Addendum)
The strep test is negative.  Follow up with your primary care provider if your symptoms are not improving.    

## 2023-03-25 ENCOUNTER — Other Ambulatory Visit: Payer: Self-pay | Admitting: Student

## 2023-03-25 DIAGNOSIS — R29818 Other symptoms and signs involving the nervous system: Secondary | ICD-10-CM

## 2023-03-25 DIAGNOSIS — G43119 Migraine with aura, intractable, without status migrainosus: Secondary | ICD-10-CM

## 2023-04-15 ENCOUNTER — Encounter: Payer: Self-pay | Admitting: Student

## 2023-04-16 ENCOUNTER — Ambulatory Visit
Admission: RE | Admit: 2023-04-16 | Discharge: 2023-04-16 | Disposition: A | Discharge status: Home / Self Care | Payer: 59 | Source: Ambulatory Visit | Attending: Student | Admitting: Student

## 2023-04-16 DIAGNOSIS — R29818 Other symptoms and signs involving the nervous system: Secondary | ICD-10-CM

## 2023-04-16 DIAGNOSIS — G43119 Migraine with aura, intractable, without status migrainosus: Secondary | ICD-10-CM

## 2023-04-16 MED ORDER — GADOPICLENOL 0.5 MMOL/ML IV SOLN
7.0000 mL | Freq: Once | INTRAVENOUS | Status: AC | PRN
  Administered 2023-04-16: 7 mL via INTRAVENOUS

## 2023-10-04 DIAGNOSIS — G2581 Restless legs syndrome: Secondary | ICD-10-CM | POA: Insufficient documentation

## 2023-10-04 NOTE — Progress Notes (Signed)
 PHQ 2/9 last 3 flowsheet values     02/01/2021    9:35 AM 09/25/2022    1:33 PM 10/04/2023   10:08 AM  PHQ-2/9 Depression Screening   Little interest or pleasure in doing things   0  Feeling down, depressed, or hopeless   0  Patient Health Questionnaire-2 Score   0  (OBSOLETE) Little interest or pleasure in doing things 0 0   (OBSOLETE) Feeling down, depressed, or hopeless (or irritable for Teens only)? 0 0   (OBSOLETE) Total Prescreening Score 0 0   (OBSOLETE) Total Score = 0 0      Depression Severity and Treatment Recommendations:  0-4= None  5-9= Mild / Treatment: Support, educate to call if worse; return in one month  10-14= Moderate / Treatment: Support, watchful waiting; Antidepressant or Psychotherapy  15-19= Moderately severe / Treatment: Antidepressant OR Psychotherapy  >= 20 = Major depression, severe / Antidepressant AND Psychotherapy  Please note approximately 15 minutes was spent and depression screening by me and nursing staff.

## 2024-05-27 ENCOUNTER — Other Ambulatory Visit: Payer: Self-pay | Admitting: Family Medicine

## 2024-05-27 ENCOUNTER — Ambulatory Visit: Admitting: Family Medicine

## 2024-05-27 ENCOUNTER — Other Ambulatory Visit: Payer: Self-pay | Admitting: Medical Genetics

## 2024-05-27 ENCOUNTER — Encounter: Payer: Self-pay | Admitting: Family Medicine

## 2024-05-27 VITALS — BP 110/70 | HR 106 | Ht 63.0 in | Wt 137.4 lb

## 2024-05-27 DIAGNOSIS — G8929 Other chronic pain: Secondary | ICD-10-CM

## 2024-05-27 DIAGNOSIS — J3089 Other allergic rhinitis: Secondary | ICD-10-CM

## 2024-05-27 DIAGNOSIS — J0110 Acute frontal sinusitis, unspecified: Secondary | ICD-10-CM

## 2024-05-27 DIAGNOSIS — Z124 Encounter for screening for malignant neoplasm of cervix: Secondary | ICD-10-CM

## 2024-05-27 DIAGNOSIS — M7918 Myalgia, other site: Secondary | ICD-10-CM

## 2024-05-27 DIAGNOSIS — E538 Deficiency of other specified B group vitamins: Secondary | ICD-10-CM

## 2024-05-27 DIAGNOSIS — F411 Generalized anxiety disorder: Secondary | ICD-10-CM

## 2024-05-27 DIAGNOSIS — Z7689 Persons encountering health services in other specified circumstances: Secondary | ICD-10-CM | POA: Diagnosis not present

## 2024-05-27 DIAGNOSIS — G43909 Migraine, unspecified, not intractable, without status migrainosus: Secondary | ICD-10-CM

## 2024-05-27 DIAGNOSIS — Z131 Encounter for screening for diabetes mellitus: Secondary | ICD-10-CM

## 2024-05-27 DIAGNOSIS — Z13220 Encounter for screening for lipoid disorders: Secondary | ICD-10-CM

## 2024-05-27 DIAGNOSIS — Z0000 Encounter for general adult medical examination without abnormal findings: Secondary | ICD-10-CM

## 2024-05-27 DIAGNOSIS — Z8639 Personal history of other endocrine, nutritional and metabolic disease: Secondary | ICD-10-CM

## 2024-05-27 DIAGNOSIS — Z1329 Encounter for screening for other suspected endocrine disorder: Secondary | ICD-10-CM

## 2024-05-27 MED ORDER — AZITHROMYCIN 250 MG PO TABS: ORAL_TABLET | ORAL | 0 refills | Status: DC

## 2024-05-27 MED ORDER — DULOXETINE HCL 60 MG PO CPEP: 60.0000 mg | ORAL_CAPSULE | Freq: Every day | ORAL | 2 refills | Status: AC

## 2024-05-27 MED ORDER — IPRATROPIUM BROMIDE 0.06 % NA SOLN: 2.0000 | Freq: Four times a day (QID) | NASAL | 0 refills | Status: DC

## 2024-05-27 NOTE — Patient Instructions (Addendum)
 Thank you for coming to the office today.  Labs today for diagnostic  Referral to [facility] for routine screening  Today some diagnostics labs for autoimmune conditions.  Upcoming labs will be for routine prevention.  Start Azithromycin  Z pak (antibiotic) 2 tabs day 1, then 1 tab x 4 days, complete entire course even if improved  Start Atrovent  nasal spray decongestant 2 sprays in each nostril up to 4 times daily for 7 days  Refilled Duloxetine  60mg  daily   Please schedule a Follow-up Appointment to: Return in about 2 weeks (around 06/10/2024) for 06/10/2024 Upcoming labs and physical.  If you have any other questions or concerns, please feel free to call the office or send a message through MyChart. You may also schedule an earlier appointment if necessary.  Additionally, you may be receiving a survey about your experience at our office within a few days to 1 week by e-mail or mail. We value your feedback.  Marsa Officer, DO [facility], [place]

## 2024-05-27 NOTE — Progress Notes (Signed)
 Subjective:    Patient ID: Vanessa Dalton, female    DOB: 1985-12-12, 38 y.o.   MRN: [id number]  Vanessa Dalton is a 38 y.o. female presenting on 05/27/2024 for Establish Care, Migraine, and Pain   HPI  Discussed the use of AI scribe software for clinical note transcription with the patient, who gave verbal consent to proceed.  History of Present Illness   Vanessa Dalton is a 38 year old female with chronic pain and tachycardia who presents for a new patient visit and medication management.  Chronic pain and associated symptoms Anxiety Chronic Joint / Muscle Pain Question if Fibromyalgia or other Connective Tissue Disorder - Chronic pain characterized by joint and skin pain, muscle twitching, and night sweats - She has had extensive lab work testing and autoimmune testing negative, also seen Functional / Integrative Medicine specialist concerns for Lyme - Duloxetine  60 mg daily provides relief for anxiety and joint pain - No rheumatology evaluation completed despite referral in past  Episodic Migraine headaches - Migraines increased in frequency to monthly occurrences - Severe during teenage and college years - Managed with rizatriptan and Zofran AS NEEDED - Previously trialed Aimovig but discontinued due to side effects  Inappropiriate Sinus Tachycardia Prior work up w Cardiology - Metoprolol 50 mg extended-release used for management - Recently obtained a 90-day supply; no refill needed at this time  History Obesity / Weight management - Mounjaro used every other week as a maintenance dose - Achieved 100-pound weight loss Weight management maintenance Successful with 100 lb weight loss Zepbound 15mg , started low dose and titrated up, she has reduced to every other week Not covered, using manufacturer coupon and high cost She tried CGM patch for monitoring glycemia  Sinusitis - Sinus issues occur primarily in spring and fall - Frequent sinus infections often require  antibiotics - Uses Atrovent  and Flonase nasal sprays for symptom management      Previously with [facility], needs referral to return  Fam History Rheumatoid Arthritis   History w/ [facility] Symptoms improved with reduced night-sweats, much better        No data to display              No data to display           Past Medical History:  Diagnosis Date   Anxiety    Obesity    Tachycardia    Past Surgical History:  Procedure Laterality Date   BREAST SURGERY     reduction   Social History   Socioeconomic History   Marital status: Married    Spouse name: Not on file   Number of children: Not on file   Years of education: Not on file   Highest education level: Associate degree: academic program  Occupational History   Not on file  Tobacco Use   Smoking status: Never   Smokeless tobacco: Never  Vaping Use   Vaping status: Never Used  Substance and Sexual Activity   Alcohol use: No   Drug use: Never   Sexual activity: Yes    Birth control/protection: None  Other Topics Concern   Not on file  Social History Narrative   Not on file   Social Drivers of Health   Financial Resource Strain: Low Risk  (05/26/2024)   Overall Financial Resource Strain (CARDIA)    Difficulty of Paying Living Expenses: Not hard at all  Food Insecurity: No Food Insecurity (05/26/2024)   Hunger Vital  Sign    Worried About Programme researcher, broadcasting/film/video in the Last Year: Never true    Ran Out of Food in the Last Year: Never true  Transportation Needs: No Transportation Needs (05/26/2024)   PRAPARE - Administrator, Civil Service (Medical): No    Lack of Transportation (Non-Medical): No  Physical Activity: Inactive (05/26/2024)   Exercise Vital Sign    Days of Exercise per Week: 0 days    Minutes of Exercise per Session: Not on file  Stress: No Stress Concern Present (05/26/2024)   [employer] of Occupational Health - Occupational Stress  Questionnaire    Feeling of Stress: Not at all  Social Connections: Socially Integrated (05/26/2024)   Social Connection and Isolation Panel    Frequency of Communication with Friends and Family: More than three times a week    Frequency of Social Gatherings with Friends and Family: More than three times a week    Attends Religious Services: More than 4 times per year    Active Member of [employer] or Organizations: Yes    Attends Engineer, structural: More than 4 times per year    Marital Status: Married  Catering manager Violence: Not on file   Family History  Problem Relation Age of Onset   Hypertension Mother    Atrial fibrillation Mother    Diabetes Father    Healthy Brother    Colon cancer Maternal Grandmother    Liver cancer Maternal Grandmother    Lung cancer Maternal Grandfather    Current Outpatient Medications on File Prior to Visit  Medication Sig   metoprolol succinate (TOPROL-XL) 50 MG 24 hr tablet Take 50 mg by mouth daily.   ondansetron (ZOFRAN) 4 MG tablet Take 4 mg by mouth every 8 (eight) hours as needed. for nausea   rizatriptan (MAXALT-MLT) 10 MG disintegrating tablet DISSOLVE ONE TABLET BY MOUTH AT ONSET OF MIGRAINE AS NEEDED AS DIRECTED   ZEPBOUND 15 MG/0.5ML Pen Inject 15 mg into the skin once a week.   No current facility-administered medications on file prior to visit.    Review of Systems Per HPI unless specifically indicated above     Objective:    BP 110/70 (BP Location: Right Arm, Patient Position: Sitting, Cuff Size: Normal)   Pulse (!) 106   Ht 5' 3 (1.6 m)   Wt 137 lb 6 oz (62.3 kg)   SpO2 98%   BMI 24.33 kg/m   Wt Readings from Last 3 Encounters:  05/27/24 137 lb 6 oz (62.3 kg)  03/21/21 233 lb (105.7 kg)  05/12/19 210 lb (95.3 kg)    Physical Exam Vitals and nursing note reviewed.  Constitutional:      General: She is not in acute distress.    Appearance: Normal appearance. She is well-developed. She is not diaphoretic.      Comments: Well-appearing, comfortable, cooperative  HENT:     Head: Normocephalic and atraumatic.  Eyes:     General:        Right eye: No discharge.        Left eye: No discharge.     Conjunctiva/sclera: Conjunctivae normal.  Cardiovascular:     Rate and Rhythm: Normal rate.  Pulmonary:     Effort: Pulmonary effort is normal.  Skin:    General: Skin is warm and dry.     Findings: No erythema or rash.  Neurological:     Mental Status: She is alert and oriented to person, place, and  time.  Psychiatric:        Mood and Affect: Mood normal.        Behavior: Behavior normal.        Thought Content: Thought content normal.     Comments: Well groomed, good eye contact, normal speech and thoughts     Results for orders placed or performed during the hospital encounter of 03/08/23  POCT rapid strep A   Collection Time: 03/08/23 10:49 AM  Result Value Ref Range   Rapid Strep A Screen Negative Negative      Assessment & Plan:   Problem List Items Addressed This Visit     Allergic rhinitis   Chronic myofascial pain   Relevant Medications   DULoxetine  (CYMBALTA ) 60 MG capsule   Other Relevant Orders   Rheumatoid Factor   Sed Rate (ESR)   Cyclic citrul peptide antibody, IgG   ANA   C-reactive protein   ANA   Episodic migraine   Relevant Medications   DULoxetine  (CYMBALTA ) 60 MG capsule   GAD (generalized anxiety disorder) - Primary   Relevant Medications   DULoxetine  (CYMBALTA ) 60 MG capsule   Other Visit Diagnoses       Encounter to establish care with new doctor         Cervical cancer screening       Relevant Orders   Ambulatory referral to Obstetrics / Gynecology     History of obesity       Relevant Medications   ZEPBOUND 15 MG/0.5ML Pen     B12 deficiency       Relevant Orders   Vitamin B12     Acute non-recurrent frontal sinusitis       Relevant Medications   ipratropium (ATROVENT ) 0.06 % nasal spray   azithromycin  (ZITHROMAX  Z-PAK) 250 MG tablet        Establish care, review outside records Updated Health Maintenance information Review outside labs Encouraged improvement to lifestyle with diet and exercise   Chronic myofascial pain syndrome with joint and skin pain, possibly fibromyalgia Symptoms include muscle twitching, night sweats, and restless legs. Previous extensive autoimmune workup negative. Lyme disease considered but not confirmed. This was managed by prior PCP in past with work up and also Integrative Medication Doctor with extensive lab testing, treated for Lyme but ultimately symptoms never resolved. Fam history rheumatoid arthritis Symptoms partially managed with duloxetine . Again we will pursue rheumatoid / autoimmune screening panel - Order B12, rheumatoid, and inflammation testing today. - Continue duloxetine  60 mg daily. - Consider rheumatology referral if future labs indicate autoimmune activity.  Restless legs syndrome Part of the chronic pain syndrome. Discussion further on treatment options On SNRI now  Episodic Migraine Improved. Previously severe, now managed with rizatriptan ODT abortive therapy Occur monthly but controlled with medication. - Continue rizatriptan as needed. - Consider newer migraine medications like Nurtec or Ubrelvy if current regimen becomes ineffective.  Chronic sinusitis with recurrent acute exacerbations Currently experiencing symptoms consistent with sinus infection. - Prescribe azithromycin  (Z-Pak). - Prescribe Atrovent  nasal spray.  Generalized anxiety disorder Managed with duloxetine , which also aids in managing chronic pain and migraines. - Continue duloxetine  60 mg daily.  Tachycardia Previously with Cardiology, no further management, now maintains on medication Managed with metoprolol 50 mg extended release. - Continue metoprolol 50 mg extended release.  History of Obesity Weight maintenance now on GLP1 therapy. Previously managed with tirzepatide Zepbound every  other week at top dose 15mg , resulting in significant weight loss. Currently maintaining weight with  biweekly dosing due to insurance coverage issues. - Continue tirzepatide every other week as maintenance dose. 15mg  per dose. She uses copay coupon and pays at cost.   Vitamin B12 deficiency anemia Previous labs indicated low B12 levels. - Order B12 testing today.         Orders Placed This Encounter  Procedures   Vitamin B12   Rheumatoid Factor   Sed Rate (ESR)   Cyclic citrul peptide antibody, IgG   ANA   C-reactive protein   ANA   Ambulatory referral to Obstetrics / Gynecology    Referral Priority:   Routine    Referral Type:   Consultation    Referral Reason:   Specialty Services Required    Requested Specialty:   Obstetrics and Gynecology    Number of Visits Requested:   1    Meds ordered this encounter  Medications   DULoxetine  (CYMBALTA ) 60 MG capsule    Sig: Take 1 capsule (60 mg total) by mouth daily.    Dispense:  30 capsule    Refill:  2   ipratropium (ATROVENT ) 0.06 % nasal spray    Sig: Place 2 sprays into both nostrils 4 (four) times daily. For up to 5-7 days then stop.    Dispense:  15 mL    Refill:  0   azithromycin  (ZITHROMAX  Z-PAK) 250 MG tablet    Sig: Take 2 tabs (500mg  total) on Day 1. Take 1 tab (250mg ) daily for next 4 days.    Dispense:  6 tablet    Refill:  0     Follow up plan: Return in about 2 weeks (around 06/10/2024) for 06/10/2024 Upcoming labs and physical.  Future labs 06/10/24  Marsa Officer, DO [facility] 05/27/2024, 10:19 AM

## 2024-05-29 ENCOUNTER — Ambulatory Visit: Payer: Self-pay | Admitting: Family Medicine

## 2024-05-29 DIAGNOSIS — R7982 Elevated C-reactive protein (CRP): Secondary | ICD-10-CM

## 2024-05-29 DIAGNOSIS — R768 Other specified abnormal immunological findings in serum: Secondary | ICD-10-CM | POA: Insufficient documentation

## 2024-05-29 DIAGNOSIS — M7918 Myalgia, other site: Secondary | ICD-10-CM

## 2024-05-29 LAB — CYCLIC CITRUL PEPTIDE ANTIBODY, IGG: Cyclic Citrullin Peptide Ab: <16 U

## 2024-05-29 LAB — C-REACTIVE PROTEIN: CRP: 41.7 mg/L — ABNORMAL HIGH

## 2024-05-29 LAB — SEDIMENTATION RATE: Sed Rate: 11 mm/h (ref 0–20)

## 2024-05-29 LAB — VITAMIN B12: Vitamin B-12: 484 pg/mL (ref 200–1100)

## 2024-05-29 LAB — ANA: Anti Nuclear Antibody (ANA): POSITIVE — AB

## 2024-05-29 LAB — ANTI-NUCLEAR AB-TITER (ANA TITER): ANA Titer 1: 1:40 {titer} — ABNORMAL HIGH

## 2024-05-29 LAB — RHEUMATOID FACTOR: Rheumatoid fact SerPl-aCnc: <10 [IU]/mL

## 2024-06-03 ENCOUNTER — Other Ambulatory Visit
Admission: RE | Admit: 2024-06-03 | Discharge: 2024-06-03 | Disposition: A | Discharge status: Home / Self Care | Payer: Self-pay | Source: Ambulatory Visit | Attending: Medical Genetics | Admitting: Medical Genetics

## 2024-06-10 ENCOUNTER — Other Ambulatory Visit

## 2024-06-10 DIAGNOSIS — Z13220 Encounter for screening for lipoid disorders: Secondary | ICD-10-CM

## 2024-06-10 DIAGNOSIS — Z131 Encounter for screening for diabetes mellitus: Secondary | ICD-10-CM

## 2024-06-10 DIAGNOSIS — Z0000 Encounter for general adult medical examination without abnormal findings: Secondary | ICD-10-CM

## 2024-06-10 DIAGNOSIS — Z1329 Encounter for screening for other suspected endocrine disorder: Secondary | ICD-10-CM

## 2024-06-11 ENCOUNTER — Ambulatory Visit: Payer: Self-pay | Admitting: Family Medicine

## 2024-06-11 LAB — CBC WITH DIFFERENTIAL/PLATELET
Absolute Lymphocytes: 1997 {cells}/uL (ref 850–3900)
Absolute Monocytes: 380 {cells}/uL (ref 200–950)
Basophils Absolute: 52 {cells}/uL (ref 0–200)
Basophils Relative: 1 %
Eosinophils Absolute: 151 {cells}/uL (ref 15–500)
Eosinophils Relative: 2.9 %
HCT: 33.4 % — ABNORMAL LOW (ref 35.0–45.0)
Hemoglobin: 10.9 g/dL — ABNORMAL LOW (ref 11.7–15.5)
MCH: 29.9 pg (ref 27.0–33.0)
MCHC: 32.6 g/dL (ref 32.0–36.0)
MCV: 91.5 fL (ref 80.0–100.0)
MPV: 11 fL (ref 7.5–12.5)
Monocytes Relative: 7.3 %
Neutro Abs: 2621 {cells}/uL (ref 1500–7800)
Neutrophils Relative %: 50.4 %
Platelets: 321 Thousand/uL (ref 140–400)
RBC: 3.65 Million/uL — ABNORMAL LOW (ref 3.80–5.10)
RDW: 12.1 % (ref 11.0–15.0)
Total Lymphocyte: 38.4 %
WBC: 5.2 Thousand/uL (ref 3.8–10.8)

## 2024-06-11 LAB — COMPREHENSIVE METABOLIC PANEL WITH GFR
AG Ratio: 2.1 (calc) (ref 1.0–2.5)
ALT: 21 U/L (ref 6–29)
AST: 16 U/L (ref 10–30)
Albumin: 4.4 g/dL (ref 3.6–5.1)
Alkaline phosphatase (APISO): 33 U/L (ref 31–125)
BUN: 17 mg/dL (ref 7–25)
CO2: 29 mmol/L (ref 20–32)
Calcium: 8.8 mg/dL (ref 8.6–10.2)
Chloride: 105 mmol/L (ref 98–110)
Creat: 0.74 mg/dL (ref 0.50–0.97)
Globulin: 2.1 g/dL (ref 1.9–3.7)
Glucose, Bld: 76 mg/dL (ref 65–99)
Potassium: 4.4 mmol/L (ref 3.5–5.3)
Sodium: 139 mmol/L (ref 135–146)
Total Bilirubin: 0.5 mg/dL (ref 0.2–1.2)
Total Protein: 6.5 g/dL (ref 6.1–8.1)
eGFR: 107 mL/min/1.73m2 (ref >=60)

## 2024-06-11 LAB — HEMOGLOBIN A1C
Hgb A1c MFr Bld: 5 % (ref <5.7)
Mean Plasma Glucose: 97 mg/dL
eAG (mmol/L): 5.4 mmol/L

## 2024-06-11 LAB — LIPID PANEL
Cholesterol: 147 mg/dL (ref <200)
HDL: 74 mg/dL (ref >=50)
LDL Cholesterol (Calc): 64 mg/dL
Non-HDL Cholesterol (Calc): 73 mg/dL (ref <130)
Total CHOL/HDL Ratio: 2 (calc) (ref <5.0)
Triglycerides: 32 mg/dL (ref <150)

## 2024-06-11 LAB — TSH: TSH: 1.21 m[IU]/L

## 2024-06-14 LAB — GENECONNECT MOLECULAR SCREEN: Genetic Analysis Overall Interpretation: NEGATIVE

## 2024-06-18 ENCOUNTER — Encounter: Admitting: Family Medicine

## 2024-06-22 ENCOUNTER — Encounter: Payer: Self-pay | Admitting: Family Medicine

## 2024-06-22 ENCOUNTER — Ambulatory Visit: Admitting: Family Medicine

## 2024-06-22 VITALS — BP 108/68 | HR 85 | Ht 63.0 in | Wt 138.2 lb

## 2024-06-22 DIAGNOSIS — G43909 Migraine, unspecified, not intractable, without status migrainosus: Secondary | ICD-10-CM | POA: Diagnosis not present

## 2024-06-22 DIAGNOSIS — Z0000 Encounter for general adult medical examination without abnormal findings: Secondary | ICD-10-CM

## 2024-06-22 DIAGNOSIS — M7918 Myalgia, other site: Secondary | ICD-10-CM

## 2024-06-22 DIAGNOSIS — D649 Anemia, unspecified: Secondary | ICD-10-CM | POA: Insufficient documentation

## 2024-06-22 DIAGNOSIS — R768 Other specified abnormal immunological findings in serum: Secondary | ICD-10-CM

## 2024-06-22 DIAGNOSIS — Z8669 Personal history of other diseases of the nervous system and sense organs: Secondary | ICD-10-CM

## 2024-06-22 DIAGNOSIS — F411 Generalized anxiety disorder: Secondary | ICD-10-CM | POA: Diagnosis not present

## 2024-06-22 DIAGNOSIS — G8929 Other chronic pain: Secondary | ICD-10-CM

## 2024-06-22 DIAGNOSIS — R5383 Other fatigue: Secondary | ICD-10-CM

## 2024-06-22 DIAGNOSIS — I4711 Inappropriate sinus tachycardia, so stated: Secondary | ICD-10-CM

## 2024-06-22 MED ORDER — METOPROLOL SUCCINATE ER 50 MG PO TB24: 50.0000 mg | ORAL_TABLET | Freq: Every day | ORAL | 3 refills | Status: AC

## 2024-06-22 NOTE — Progress Notes (Signed)
 Subjective:    Patient ID: Vanessa Dalton, female    DOB: 29-Jan-1986, 38 y.o.   MRN: [id number]  Vanessa Dalton is a 38 y.o. female presenting on 06/22/2024 for Annual Exam   HPI  Discussed the use of AI scribe software for clinical note transcription with the patient, who gave verbal consent to proceed.  History of Present Illness   Vanessa Dalton is a 38 year old female who presents for a follow-up visit to review lab results and discuss ongoing symptoms.  Fatigue and normocytic anemia - Persistent fatigue described as 'tired all the time' despite adequate sleep without disturbances - Recent hemoglobin level of 10.9 g/dL - History of low hemoglobin levels in the past 11 range, but previously 12 range is most common for her, so this is gradually lower  Rheumatology Recent labs Positive antinuclear antibody (ana), on screening - Previous laboratory panel showed a positive ANA marker She has extensive history of chronic joint and muscle pain, previously documented. - Duloxetine  60 mg daily provides relief for anxiety and joint pain - Rheumatology appointment scheduled for further evaluation, 07/20/24  Weight Maintenance Weight loss and dietary modification Previous Obesity diagnosis. Now in range - Significant weight loss from the 230s to 138 pounds - Weight maintained through dietary changes, specifically reducing preservatives to manage inflammation Uses Zepbound 15mg  every other week, not covered by insurance   Sleep apnea - Previously diagnosed with sleep apnea - No longer experiences snoring after weight loss. Not on CPAP  Lymphadenopathy - Swollen lymph node under the arm resolved after a few days  Immunization and infectious disease screening - Declined influenza and COVID-19 boosters - No history of HPV vaccination and does not plan to receive it - Completed hepatitis B vaccine series during school - Hepatitis C screening negative    Episodic Migraine headaches -  Migraines increased in frequency to monthly occurrences - Severe during teenage and college years - Managed with rizatriptan and Zofran AS NEEDED - Previously trialed Aimovig but discontinued due to side effects   Inappropiriate Sinus Tachycardia Prior work up w Cardiology - Metoprolol 50 mg extended-release used for management Refill  Fam History Rheumatoid Arthritis    Health Maintenance:  Decline HPV Vaccine series, did not receive before  Future GYN scheduled 10/27, due for updated pap smear     06/22/2024    9:38 AM  Depression screen PHQ 2/9  Decreased Interest 0  Down, Depressed, Hopeless 0  PHQ - 2 Score 0  Altered sleeping 1  Tired, decreased energy 2  Change in appetite 0  Feeling bad or failure about yourself  0  Trouble concentrating 0  Moving slowly or fidgety/restless 0  Suicidal thoughts 0  PHQ-9 Score 3  Difficult doing work/chores Somewhat difficult       06/22/2024    9:38 AM  GAD 7 : Generalized Anxiety Score  Nervous, Anxious, on Edge 1  Control/stop worrying 0  Worry too much - different things 0  Trouble relaxing 0  Restless 0  Easily annoyed or irritable 0  Afraid - awful might happen 0  Total GAD 7 Score 1  Anxiety Difficulty Not difficult at all     Past Medical History:  Diagnosis Date   Allergy    Anemia    Anxiety    GERD (gastroesophageal reflux disease)    Obesity    Sleep apnea    Tachycardia    Past Surgical History:  Procedure Laterality Date  BREAST SURGERY     reduction   Social History   Socioeconomic History   Marital status: Married    Spouse name: Not on file   Number of children: Not on file   Years of education: Not on file   Highest education level: Associate degree: academic program  Occupational History   Not on file  Tobacco Use   Smoking status: Never   Smokeless tobacco: Never  Vaping Use   Vaping status: Never Used  Substance and Sexual Activity   Alcohol use: No   Drug use: Never    Sexual activity: Yes    Birth control/protection: None  Other Topics Concern   Not on file  Social History Narrative   Not on file   Social Drivers of Health   Financial Resource Strain: Low Risk  (05/26/2024)   Overall Financial Resource Strain (CARDIA)    Difficulty of Paying Living Expenses: Not hard at all  Food Insecurity: No Food Insecurity (05/26/2024)   Hunger Vital Sign    Worried About Running Out of Food in the Last Year: Never true    Ran Out of Food in the Last Year: Never true  Transportation Needs: No Transportation Needs (05/26/2024)   PRAPARE - Administrator, Civil Service (Medical): No    Lack of Transportation (Non-Medical): No  Physical Activity: Inactive (05/26/2024)   Exercise Vital Sign    Days of Exercise per Week: 0 days    Minutes of Exercise per Session: Not on file  Stress: No Stress Concern Present (05/26/2024)   [employer] of Occupational Health - Occupational Stress Questionnaire    Feeling of Stress: Not at all  Social Connections: Socially Integrated (05/26/2024)   Social Connection and Isolation Panel    Frequency of Communication with Friends and Family: More than three times a week    Frequency of Social Gatherings with Friends and Family: More than three times a week    Attends Religious Services: More than 4 times per year    Active Member of [employer] or Organizations: Yes    Attends Engineer, structural: More than 4 times per year    Marital Status: Married  Catering manager Violence: Not on file   Family History  Problem Relation Age of Onset   Hypertension Mother    Atrial fibrillation Mother    Arthritis Mother    Asthma Mother    Heart disease Mother    Diabetes Father    Healthy Brother    Colon cancer Maternal Grandmother    Liver cancer Maternal Grandmother    Cancer Maternal Grandmother    Lung cancer Maternal Grandfather    Alcohol abuse Maternal Grandfather    Arthritis Maternal Grandfather    Cancer  Maternal Grandfather    COPD Maternal Grandfather    Heart disease Maternal Grandfather    Hypertension Maternal Grandfather    Arthritis Paternal Grandmother    Cancer Paternal Grandmother    COPD Paternal Grandmother    Heart disease Paternal Grandmother    ADD / ADHD Brother    Asthma Son    Current Outpatient Medications on File Prior to Visit  Medication Sig   DULoxetine  (CYMBALTA ) 60 MG capsule Take 1 capsule (60 mg total) by mouth daily.   ondansetron (ZOFRAN) 4 MG tablet Take 4 mg by mouth every 8 (eight) hours as needed. for nausea   rizatriptan (MAXALT-MLT) 10 MG disintegrating tablet DISSOLVE ONE TABLET BY MOUTH AT ONSET OF MIGRAINE AS  NEEDED AS DIRECTED   ZEPBOUND 15 MG/0.5ML Pen Inject 15 mg into the skin once a week.   No current facility-administered medications on file prior to visit.    Review of Systems  Constitutional:  Positive for fatigue. Negative for activity change, appetite change, chills, diaphoresis and fever.  HENT:  Negative for congestion and hearing loss.   Eyes:  Negative for visual disturbance.  Respiratory:  Negative for cough, chest tightness, shortness of breath and wheezing.   Cardiovascular:  Negative for chest pain, palpitations and leg swelling.  Gastrointestinal:  Negative for abdominal pain, constipation, diarrhea, nausea and vomiting.  Genitourinary:  Negative for dysuria, frequency and hematuria.  Musculoskeletal:  Positive for arthralgias. Negative for neck pain.  Skin:  Negative for rash.  Neurological:  Negative for dizziness, weakness, light-headedness, numbness and headaches.  Hematological:  Negative for adenopathy.  Psychiatric/Behavioral:  Negative for behavioral problems, dysphoric mood and sleep disturbance.    Per HPI unless specifically indicated above     Objective:    BP 108/68 (BP Location: Left Arm, Patient Position: Sitting, Cuff Size: Normal)   Pulse 85   Ht 5' 3 (1.6 m)   Wt 138 lb 3.2 oz (62.7 kg)   LMP  06/21/2024 (Exact Date)   SpO2 100%   BMI 24.48 kg/m   Wt Readings from Last 3 Encounters:  06/22/24 138 lb 3.2 oz (62.7 kg)  05/27/24 137 lb 6 oz (62.3 kg)  03/21/21 233 lb (105.7 kg)    Physical Exam Vitals and nursing note reviewed.  Constitutional:      General: She is not in acute distress.    Appearance: She is well-developed. She is not diaphoretic.     Comments: Well-appearing, comfortable, cooperative  HENT:     Head: Normocephalic and atraumatic.  Eyes:     General:        Right eye: No discharge.        Left eye: No discharge.     Conjunctiva/sclera: Conjunctivae normal.     Pupils: Pupils are equal, round, and reactive to light.  Neck:     Thyroid : No thyromegaly.  Cardiovascular:     Rate and Rhythm: Normal rate and regular rhythm.     Pulses: Normal pulses.     Heart sounds: Normal heart sounds. No murmur heard. Pulmonary:     Effort: Pulmonary effort is normal. No respiratory distress.     Breath sounds: Normal breath sounds. No wheezing or rales.  Abdominal:     General: Bowel sounds are normal. There is no distension.     Palpations: Abdomen is soft. There is no mass.     Tenderness: There is no abdominal tenderness.  Musculoskeletal:        General: No tenderness. Normal range of motion.     Cervical back: Normal range of motion and neck supple.     Right lower leg: No edema.     Left lower leg: No edema.     Comments: Upper / Lower Extremities: - Normal muscle tone, strength bilateral upper extremities 5/5, lower extremities 5/5  Lymphadenopathy:     Cervical: No cervical adenopathy.  Skin:    General: Skin is warm and dry.     Findings: No erythema or rash.  Neurological:     Mental Status: She is alert and oriented to person, place, and time.     Comments: Distal sensation intact to light touch all extremities  Psychiatric:        Mood  and Affect: Mood normal.        Behavior: Behavior normal.        Thought Content: Thought content normal.      Comments: Well groomed, good eye contact, normal speech and thoughts     Results for orders placed or performed in visit on 06/22/24  HM HEPATITIS C SCREENING LAB   Collection Time: 03/01/23 12:00 AM  Result Value Ref Range   HM Hepatitis Screen Negative-Validated       Assessment & Plan:   Problem List Items Addressed This Visit     Chronic myofascial pain   Episodic migraine   Relevant Medications   metoprolol succinate (TOPROL-XL) 50 MG 24 hr tablet   GAD (generalized anxiety disorder)   History of obstructive sleep apnea   Inappropriate sinus node tachycardia   Relevant Medications   metoprolol succinate (TOPROL-XL) 50 MG 24 hr tablet   Positive ANA (antinuclear antibody)   Other Visit Diagnoses       Annual physical exam    -  Primary     Other fatigue            Updated Health Maintenance information Reviewed recent lab results with patient Encouraged improvement to lifestyle with diet and exercise Goal of weight loss  Normocytic anemia with associated fatigue Hemoglobin at 10.9 g/dL, normocytic anemia indicated by normal MCV. Past history Hgb 10-11 range, but previously 12+ range normally. Fatigue possibly related to anemia or underlying rheumatologic disorder. Differential includes iron deficiency anemia. - Request rheumatologist to perform iron panel and repeat CBC during upcoming visit. - Consider rechecking iron panel and CBC if not performed by rheumatologist.  Rheumatologic disorder, suspected Positive ANA marker with mild symptoms. Possible connection to anemia and fatigue. Already has been referred. - Attend rheumatology appointment on July 20, 2024 for further evaluation.  Inappropriate tachycardia Managed with metoprolol 50 mg daily. Heart rate and blood pressure well-controlled. - Refill metoprolol 50 mg daily with 90-day supply and refills.  Episodic Migraine Controlled Managed with rizatriptan and Zofran as needed.  General Health  Maintenance Declined flu shot and COVID boosters. HPV vaccine series not completed. Hepatitis B vaccination completed. Hepatitis C screening negative.  Cervical cancer screening scheduled. - Proceed with scheduled Pap smear on July 20, 2024. with OBGYN  Follow-up Plan to follow up based on rheumatology visit outcomes and any new symptoms. Routine follow-up scheduled for one year unless earlier follow-up is needed. - Consider earlier follow-up if needed based on rheumatology findings or changes in symptoms.      History of Obesity - Significant weight loss from the 230s to 138 pounds - Weight maintained through dietary changes, specifically reducing preservatives to manage inflammation Currently on Zepbound 15mg  every other week, she is purchasing it by her own funds / coupon, not going through insurance. Not covered  Lifestyle Documentation  We had a detailed discussion today reviewing course of lifestyle modifications to assist in weight loss and promote overall health. She remains motivated and committed to continuing these efforts while continues to take weight loss / appetite reducing GLP1 medication.  For diet she is not following  particular calorie reduced diet or tracking intake. She emphasizes foods that do not cause inflammation.  For exercise, less regular exercise at this point due to rheumatological and other concerns.    Future repeat CBC + Anemia Iron Panel   Orders Placed This Encounter  Procedures   [facility] HEPATITIS C SCREENING LAB    This external order was  created through the Results Console.    Meds ordered this encounter  Medications   metoprolol succinate (TOPROL-XL) 50 MG 24 hr tablet    Sig: Take 1 tablet (50 mg total) by mouth daily.    Dispense:  90 tablet    Refill:  3    Add refills     Follow up plan: Return for 1 year fasting lab > 1 week later Annual Physical.  Route chart to Dr Asberry Claw - add anemia iron panel if they are planning to  get blood or we can check re-check   Marsa Officer, DO [facility] 06/22/2024, 9:47 AM

## 2024-06-22 NOTE — Patient Instructions (Addendum)
 Thank you for coming to the office today.  Keep up with Rheumatologist I will message them and ask about adding an iron panel for anemia evaluation or we can check it if needed In future within 6 months, consider returning if further questions or symptoms or updates are needed otherwise plan to keep the 1 year.   Please schedule a Follow-up Appointment to: Return for 1 year fasting lab > 1 week later Annual Physical.  If you have any other questions or concerns, please feel free to call the office or send a message through MyChart. You may also schedule an earlier appointment if necessary.  Additionally, you may be receiving a survey about your experience at our office within a few days to 1 week by e-mail or mail. We value your feedback.  Marsa Officer, DO [facility], [place]

## 2024-07-19 NOTE — Progress Notes (Signed)
 Office Visit Note  Patient: Vanessa Dalton             Date of Birth: Jan 28, 1986           MRN: [id number]             PCP: Edman Marsa PARAS, DO Referring: Edman Marsa * Visit Date: 07/20/2024 Occupation: @GUAROCC @  Subjective:  No chief complaint on file.    History of Present Illness: Vanessa Dalton is a 38 y.o. female ***   Activities of Daily Living:  Patient reports morning stiffness for *** {minute/hour:19697}.   Patient {ACTIONS;DENIES/REPORTS:21021675::Denies} nocturnal pain.  Difficulty dressing/grooming: {ACTIONS;DENIES/REPORTS:21021675::Denies} Difficulty climbing stairs: {ACTIONS;DENIES/REPORTS:21021675::Denies} Difficulty getting out of chair: {ACTIONS;DENIES/REPORTS:21021675::Denies} Difficulty using hands for taps, buttons, cutlery, and/or writing: {ACTIONS;DENIES/REPORTS:21021675::Denies}  No Rheumatology ROS completed.    Rheum History: # Diagnosed in ***.  Manifestation of disease:   Serologies: (+) *** (-) ***  Maintenance Labs: QuantiFERON: *** Hepatitis panel: ***  Current Treatment ***  Prior Treatments ***   PMFS History:  Patient Active Problem List   Diagnosis Date Noted   Normocytic anemia 06/22/2024   Positive ANA (antinuclear antibody) 05/29/2024   Episodic migraine 05/27/2024   Chronic myofascial pain 05/27/2024   Restless legs 10/04/2023   History of obstructive sleep apnea 10/17/2021   Weight gain, abnormal 04/01/2017   BMI 37.0-37.9, adult 04/01/2017   GAD (generalized anxiety disorder) 10/23/2016   Inappropriate sinus node tachycardia 09/27/2016   PALPITATIONS 12/02/2008   URTICARIA 10/19/2008   FATIGUE 01/20/2008   Allergic rhinitis 01/19/2008   Premenstrual tension syndrome 01/19/2008    Past Medical History:  Diagnosis Date   Allergy    Anemia    Anxiety    GERD (gastroesophageal reflux disease)    Obesity    Sleep apnea    Tachycardia     Family History  Problem Relation Age  of Onset   Hypertension Mother    Atrial fibrillation Mother    Arthritis Mother    Asthma Mother    Heart disease Mother    Diabetes Father    Healthy Brother    Colon cancer Maternal Grandmother    Liver cancer Maternal Grandmother    Cancer Maternal Grandmother    Lung cancer Maternal Grandfather    Alcohol abuse Maternal Grandfather    Arthritis Maternal Grandfather    Cancer Maternal Grandfather    COPD Maternal Grandfather    Heart disease Maternal Grandfather    Hypertension Maternal Grandfather    Arthritis Paternal Grandmother    Cancer Paternal Grandmother    COPD Paternal Grandmother    Heart disease Paternal Grandmother    ADD / ADHD Brother    Asthma Son    Past Surgical History:  Procedure Laterality Date   BREAST SURGERY     reduction   Social History   Social History Narrative   Not on file   Immunization History  Administered Date(s) Administered   Tdap 06/24/2014     Objective: Vital Signs: LMP 06/21/2024 (Exact Date)    Physical Exam Vitals and nursing note reviewed.  HENT:     Head: Normocephalic and atraumatic.     Nose: Nose normal.  Eyes:     Conjunctiva/sclera: Conjunctivae normal.     Pupils: Pupils are equal, round, and reactive to light.  Cardiovascular:     Rate and Rhythm: Normal rate and regular rhythm.     Heart sounds: Normal heart sounds.  Pulmonary:     Effort: Pulmonary effort is  normal.     Breath sounds: Normal breath sounds.  Skin:    General: Skin is warm and dry.  Neurological:     Mental Status: She is alert. Mental status is at baseline.  Psychiatric:        Mood and Affect: Mood normal.        Behavior: Behavior normal.      Musculoskeletal Exam: ***  CDAI Exam: CDAI Score: -- Patient Global: --; Provider Global: -- Swollen: --; Tender: -- Joint Exam 07/20/2024   No joint exam has been documented for this visit   There is currently no information documented on the homunculus. Go to the Rheumatology  activity and complete the homunculus joint exam.  Investigation: No additional findings.  Imaging: No results found.  Recent Labs: Lab Results  Component Value Date   WBC 5.2 06/10/2024   HGB 10.9 (L) 06/10/2024   PLT 321 06/10/2024   NA 139 06/10/2024   K 4.4 06/10/2024   CL 105 06/10/2024   CO2 29 06/10/2024   GLUCOSE 76 06/10/2024   BUN 17 06/10/2024   CREATININE 0.74 06/10/2024   BILITOT 0.5 06/10/2024   ALKPHOS 56 01/20/2008   AST 16 06/10/2024   ALT 21 06/10/2024   PROT 6.5 06/10/2024   ALBUMIN 3.7 01/20/2008   CALCIUM 8.8 06/10/2024   GFRAA >60 09/15/2016   Lab Results  Component Value Date   ANA POSITIVE (A) 05/27/2024   RF <10 05/27/2024    Speciality Comments: No specialty comments available.  Procedures:  No procedures performed Allergies: Peanut-containing drug products, Corticosteroids, Doxycycline, and Latex   Assessment / Plan:     Visit Diagnoses: No diagnosis found.  #High risk medication use  Orders: No orders of the defined types were placed in this encounter.  No orders of the defined types were placed in this encounter.   I personally spent a total of *** minutes in the care of the patient today including {Time Based Coding:210964241}.  Follow-Up Instructions: No follow-ups on file.   Asberry Claw, DO

## 2024-07-20 ENCOUNTER — Encounter: Payer: Self-pay | Admitting: Obstetrics

## 2024-07-20 ENCOUNTER — Ambulatory Visit

## 2024-07-20 ENCOUNTER — Ambulatory Visit: Admitting: Obstetrics

## 2024-07-20 ENCOUNTER — Other Ambulatory Visit (HOSPITAL_COMMUNITY)
Admission: RE | Admit: 2024-07-20 | Discharge: 2024-07-20 | Disposition: A | Discharge status: Home / Self Care | Source: Ambulatory Visit | Attending: Obstetrics | Admitting: Obstetrics

## 2024-07-20 ENCOUNTER — Ambulatory Visit: Admission: RE | Admit: 2024-07-20 | Discharge: 2024-07-20 | Disposition: A | Source: Ambulatory Visit

## 2024-07-20 VITALS — BP 90/53 | HR 74 | Temp 97.8°F | Resp 12 | Ht 63.25 in | Wt 138.6 lb

## 2024-07-20 VITALS — BP 82/44 | Ht 63.0 in | Wt 136.0 lb

## 2024-07-20 DIAGNOSIS — M545 Low back pain, unspecified: Secondary | ICD-10-CM

## 2024-07-20 DIAGNOSIS — R7689 Other specified abnormal immunological findings in serum: Secondary | ICD-10-CM

## 2024-07-20 DIAGNOSIS — M797 Fibromyalgia: Secondary | ICD-10-CM

## 2024-07-20 DIAGNOSIS — D649 Anemia, unspecified: Secondary | ICD-10-CM | POA: Diagnosis not present

## 2024-07-20 DIAGNOSIS — Z7689 Persons encountering health services in other specified circumstances: Secondary | ICD-10-CM | POA: Insufficient documentation

## 2024-07-20 DIAGNOSIS — Z124 Encounter for screening for malignant neoplasm of cervix: Secondary | ICD-10-CM | POA: Insufficient documentation

## 2024-07-20 DIAGNOSIS — Z01419 Encounter for gynecological examination (general) (routine) without abnormal findings: Secondary | ICD-10-CM

## 2024-07-20 DIAGNOSIS — R7982 Elevated C-reactive protein (CRP): Secondary | ICD-10-CM | POA: Diagnosis not present

## 2024-07-20 DIAGNOSIS — R61 Generalized hyperhidrosis: Secondary | ICD-10-CM

## 2024-07-20 NOTE — Addendum Note (Signed)
 Addended by: TAMEA ANNALEE DEL on: 07/20/2024 04:24 PM   Modules accepted: Orders

## 2024-07-20 NOTE — Progress Notes (Signed)
 ANNUAL GYNECOLOGICAL EXAM  SUBJECTIVE  HPI  Vanessa Dalton is a 38 y.o.-year-old G2P2002 who presents for an annual gynecological exam today.  She denies pelvic pain and dyspareunia. Her periods have gotten somewhat irregular lately. She also reports malodorous urine, urinary frequency, vaginal itching, and discharge. She is currently being worked up by a rheumatologist for a possible autoimmune condition.   Medical/Surgical History Past Medical History:  Diagnosis Date   Allergy    Anemia    Anxiety    GERD (gastroesophageal reflux disease)    Obesity    Sleep apnea    Tachycardia    Past Surgical History:  Procedure Laterality Date   BREAST SURGERY     reduction    Social History Lives with husband and children. Feels safe there Work: [employer], finance Exercise: none now d/t low energy Substances: Occasional EtOH; denies tobacco, vape, and recreational drugs  Obstetric History OB History     Gravida  2   Para  2   Term  2   Preterm  0   AB  0   Living  2      SAB  0   IAB  0   Ectopic  0   Multiple  0   Live Births  2            GYN/Menstrual History Patient's last menstrual period was 07/07/2024 (exact date). Q 3- 5 weeks Last Pap: 2018, NILM Contraception: vasectomy  Prevention Mammogram: at 42 Colonoscopy: at 47 Flu shot: declines  Current Medications Outpatient Medications Prior to Visit  Medication Sig   DULoxetine  (CYMBALTA ) 60 MG capsule Take 1 capsule (60 mg total) by mouth daily.   metoprolol succinate (TOPROL-XL) 50 MG 24 hr tablet Take 1 tablet (50 mg total) by mouth daily.   ondansetron (ZOFRAN) 4 MG tablet Take 4 mg by mouth every 8 (eight) hours as needed. for nausea   rizatriptan (MAXALT-MLT) 10 MG disintegrating tablet DISSOLVE ONE TABLET BY MOUTH AT ONSET OF MIGRAINE AS NEEDED AS DIRECTED   ZEPBOUND 15 MG/0.5ML Pen Inject 15 mg into the skin once a week.   No facility-administered medications  prior to visit.        ROS Constitutional: +night sweats and fatigue  Eyes: Denied eye symptoms, eye pain, photophobia, vision change and visual disturbance.  Ears/Nose/Throat/Neck: Denied ear, nose, throat or neck symptoms, hearing loss, nasal discharge, sinus congestion and sore throat.  Cardiovascular: Denied cardiovascular symptoms, arrhythmia, chest pain/pressure, edema, exercise intolerance, orthopnea and palpitations.  Respiratory: Denied pulmonary symptoms, asthma, pleuritic pain, productive sputum, cough, dyspnea and wheezing.  Gastrointestinal: Denied gastro-esophageal reflux, melena, nausea and vomiting.  Genitourinary: Itching, malodorous urine, urinary frequency. Denies dysuria and hematuria  Musculoskeletal: Denied musculoskeletal symptoms, stiffness, swelling, muscle weakness and myalgia.  Dermatologic: Denied dermatology symptoms, rash and scar.  Neurologic: Denied neurology symptoms, dizziness, headache, neck pain and syncope.  Psychiatric: Denied psychiatric symptoms, anxiety and depression.  Endocrine: Denies endocrine symtpoms    OBJECTIVE  BP (!) 82/44   Ht 5' 3 (1.6 m)   Wt 136 lb (61.7 kg)   LMP 07/07/2024 (Exact Date)   BMI 24.09 kg/m    Physical examination General NAD, Conversant  HEENT Atraumatic; Op clear with mmm.  Normo-cephalic. Pupils reactive. Anicteric sclerae  Thyroid /Neck Smooth without nodularity or enlargement. Normal ROM.  Neck Supple.  Skin No rashes, lesions or ulceration. Normal palpated skin turgor. No nodularity.  Breasts: Declines  Lungs: Clear to auscultation.No rales or  wheezes. Normal Respiratory effort, no retractions.  Heart: NSR.  No murmurs or rubs appreciated. No peripheral edema  Abdomen: Soft.  Non-tender.  No masses.  No HSM. No hernia  Extremities: Moves all appropriately.  Normal ROM for age. No lymphadenopathy.  Neuro: Oriented to PPT.  Normal mood. Normal affect.     Pelvic:   Vulva: Normal appearance.  No  lesions.  Vagina: No lesions or abnormalities noted.  Support: Normal pelvic support.  Urethra No masses tenderness or scarring.  Meatus Normal size without lesions or prolapse.  Cervix: Normal appearance.  No lesions. Pap collected.  Anus: Normal exam.  No lesions.  Perineum: Normal exam.  No lesions.    ASSESSMENT  1) Annual exam 2) Due for Pap 3) Vaginal itching 4) Urinary symptoms  PLAN 1) Physical exam as noted. Discussed healthy lifestyle choices and preventive care. Declines STI testing. Routine labs with PCP. 2) Pap collected. F/u based on results 3) Swab collected. Discussed comfort measures for itching. F/u based on results 4) UA negative. Encouraged hydration, AZO, cranberry tables  Return in one year for annual exam or as needed for concerns.   Bracha Frankowski, CNM

## 2024-07-22 ENCOUNTER — Ambulatory Visit: Payer: Self-pay | Admitting: Obstetrics

## 2024-07-22 LAB — CERVICOVAGINAL ANCILLARY ONLY
Bacterial Vaginitis (gardnerella): NEGATIVE
Candida Glabrata: POSITIVE — AB
Candida Vaginitis: NEGATIVE
Comment: NEGATIVE
Comment: NEGATIVE
Comment: NEGATIVE

## 2024-07-22 LAB — PROTEIN / CREATININE RATIO, URINE
Creatinine, Urine: 69 mg/dL (ref 20–275)
Protein/Creat Ratio: 72 mg/g{creat} (ref 24–184)
Protein/Creatinine Ratio: 0.072 mg/mg{creat} (ref 0.024–0.184)
Total Protein, Urine: 5 mg/dL (ref 5–24)

## 2024-07-22 LAB — IRON,TIBC AND FERRITIN PANEL
%SAT: 30 % (ref 16–45)
Ferritin: 54 ng/mL (ref 16–154)
Iron: 86 ug/dL (ref 40–190)
TIBC: 282 ug/dL (ref 250–450)

## 2024-07-22 LAB — PROTEIN ELECTROPHORESIS, SERUM
Albumin ELP: 4.4 g/dL (ref 3.8–4.8)
Alpha 1: 0.2 g/dL (ref 0.2–0.3)
Alpha 2: 0.5 g/dL (ref 0.5–0.9)
Beta 2: 0.2 g/dL (ref 0.2–0.5)
Beta Globulin: 0.4 g/dL (ref 0.4–0.6)
Gamma Globulin: 1.1 g/dL (ref 0.8–1.7)
Total Protein: 6.9 g/dL (ref 6.1–8.1)

## 2024-07-22 LAB — KAPPA/LAMBDA LIGHT CHAINS
Kappa free light chain: 14.6 mg/L (ref 3.3–19.4)
Kappa:Lambda Ratio: 1.64 (ref 0.26–1.65)
Lambda Free Lght Chn: 8.9 mg/L (ref 5.7–26.3)

## 2024-07-22 LAB — TRANSFERRIN: Transferrin: 205 mg/dL (ref 188–341)

## 2024-07-22 LAB — ANTI-DNA ANTIBODY, DOUBLE-STRANDED: ds DNA Ab: <1 [IU]/mL

## 2024-07-22 LAB — HLA-B27 ANTIGEN: HLA-B27 Antigen: NEGATIVE

## 2024-07-22 LAB — CBC
HCT: 34.3 % — ABNORMAL LOW (ref 35.0–45.0)
Hemoglobin: 11.2 g/dL — ABNORMAL LOW (ref 11.7–15.5)
MCH: 30.2 pg (ref 27.0–33.0)
MCHC: 32.7 g/dL (ref 32.0–36.0)
MCV: 92.5 fL (ref 80.0–100.0)
MPV: 11.6 fL (ref 7.5–12.5)
Platelets: 308 10*3/uL (ref 140–400)
RBC: 3.71 Million/uL — ABNORMAL LOW (ref 3.80–5.10)
RDW: 11.9 % (ref 11.0–15.0)
WBC: 5.2 10*3/uL (ref 3.8–10.8)

## 2024-07-22 LAB — SJOGREN'S SYNDROME ANTIBODS(SSA + SSB)
SSA (Ro) (ENA) Antibody, IgG: <1 AI
SSB (La) (ENA) Antibody, IgG: <1 AI

## 2024-07-22 LAB — IGG, IGA, IGM
IgG (Immunoglobin G), Serum: 1255 mg/dL (ref 600–1640)
IgM, Serum: 54 mg/dL (ref 50–300)
Immunoglobulin A: 162 mg/dL (ref 47–310)

## 2024-07-22 LAB — C3 AND C4
C3 Complement: 73 mg/dL — ABNORMAL LOW (ref 83–193)
C4 Complement: 9 mg/dL — ABNORMAL LOW (ref 15–57)

## 2024-07-22 LAB — RNP ANTIBODY: Ribonucleic Protein(ENA) Antibody, IgG: <1 AI

## 2024-07-22 LAB — C-REACTIVE PROTEIN: CRP: <3 mg/L

## 2024-07-22 LAB — ANTI-SMITH ANTIBODY: ENA SM Ab Ser-aCnc: <1 AI

## 2024-07-22 LAB — SEDIMENTATION RATE: Sed Rate: 2 mm/h (ref 0–20)

## 2024-07-23 DIAGNOSIS — R292 Abnormal reflex: Secondary | ICD-10-CM

## 2024-07-23 DIAGNOSIS — L509 Urticaria, unspecified: Secondary | ICD-10-CM

## 2024-07-23 DIAGNOSIS — R7689 Other specified abnormal immunological findings in serum: Secondary | ICD-10-CM

## 2024-07-23 DIAGNOSIS — D841 Defects in the complement system: Secondary | ICD-10-CM

## 2024-07-23 DIAGNOSIS — G629 Polyneuropathy, unspecified: Secondary | ICD-10-CM

## 2024-07-23 LAB — CYTOLOGY - PAP
Comment: NEGATIVE
Diagnosis: UNDETERMINED — AB
High risk HPV: NEGATIVE

## 2024-07-29 ENCOUNTER — Other Ambulatory Visit: Payer: Self-pay | Admitting: *Deleted

## 2024-07-29 DIAGNOSIS — L509 Urticaria, unspecified: Secondary | ICD-10-CM

## 2024-07-29 DIAGNOSIS — D841 Defects in the complement system: Secondary | ICD-10-CM

## 2024-07-29 DIAGNOSIS — R7689 Other specified abnormal immunological findings in serum: Secondary | ICD-10-CM

## 2024-08-03 MED ORDER — FLUCONAZOLE 150 MG PO TABS
150.0000 mg | ORAL_TABLET | Freq: Once | ORAL | 0 refills | Status: AC
Start: 2024-08-03 — End: 2024-08-03

## 2024-08-05 ENCOUNTER — Encounter (INDEPENDENT_AMBULATORY_CARE_PROVIDER_SITE_OTHER): Payer: Self-pay | Admitting: Family Medicine

## 2024-08-05 DIAGNOSIS — T753XXA Motion sickness, initial encounter: Secondary | ICD-10-CM | POA: Diagnosis not present

## 2024-08-05 LAB — COMPLEMENT COMPONENT C1Q: Complement C1Q: 5 mg/dL (ref 5.0–8.6)

## 2024-08-05 LAB — COMPLEMENT, TOTAL: Compl, Total (CH50): 37 U/mL (ref 31–60)

## 2024-08-05 LAB — IGG, IGA, IGM
IgG (Immunoglobin G), Serum: 1163 mg/dL (ref 600–1640)
IgM, Serum: 51 mg/dL (ref 50–300)
Immunoglobulin A: 152 mg/dL (ref 47–310)

## 2024-08-05 MED ORDER — SCOPOLAMINE 1 MG/3DAYS TD PT72: 1.0000 | MEDICATED_PATCH | TRANSDERMAL | 1 refills | Status: AC

## 2024-08-05 NOTE — Telephone Encounter (Signed)
Please see the MyChart message reply(ies) for my assessment and plan.    This patient gave consent for this Medical Advice Message and is aware that it may result in a bill to their insurance company, as well as the possibility of receiving a bill for a co-payment or deductible. They are an established patient, but are not seeking medical advice exclusively about a problem treated during an in person or video visit in the last seven days. I did not recommend an in person or video visit within seven days of my reply.    I spent a total of 5 minutes cumulative time within 7 days through MyChart messaging.  Axelle Szwed, DO   

## 2024-08-11 ENCOUNTER — Encounter: Payer: Self-pay | Admitting: Licensed Practical Nurse

## 2024-08-11 NOTE — Progress Notes (Signed)
 Pt sent mychart message reporting continuation  of vaginal symptoms after using Boric Acid for 1 week and she thinks she has oral thrush.  Called pt, LVM and sent mychart message.  We need more information on her symptoms that she is experiencing-especially oral symptoms. Rec continue Boric acid for another week. Consider Diflucan 200mg  x 14 days-this can treat both oral thrush and candida glabrata. If thrush less likely then can consider other treatments for candida glabrata such at Monistat 3.  Jinnie Cookey, CNM  [facility] 08/11/24  6:08 PM  .

## 2024-08-12 ENCOUNTER — Telehealth (INDEPENDENT_AMBULATORY_CARE_PROVIDER_SITE_OTHER): Admitting: Family Medicine

## 2024-08-12 DIAGNOSIS — M7918 Myalgia, other site: Secondary | ICD-10-CM

## 2024-08-12 DIAGNOSIS — G8929 Other chronic pain: Secondary | ICD-10-CM | POA: Diagnosis not present

## 2024-08-12 DIAGNOSIS — M797 Fibromyalgia: Secondary | ICD-10-CM

## 2024-08-12 MED ORDER — PREGABALIN 50 MG PO CAPS
50.0000 mg | ORAL_CAPSULE | Freq: Two times a day (BID) | ORAL | 2 refills | Status: AC
Start: 2024-08-12 — End: ?

## 2024-08-12 NOTE — Patient Instructions (Addendum)
 Start Pregabalin generic Lyric 50mg  capsules for fibromyalgia pain Start one capsule per day for 1 week then go up to 1 capsule twice a day. Wait until after the cruise to avoid possible side effects w/ dizziness  Contact me back if questions or concerns. We can adjust dose in future if needed.  Please schedule a Follow-up Appointment to: Return if symptoms worsen or fail to improve.  If you have any other questions or concerns, please feel free to call the office or send a message through MyChart. You may also schedule an earlier appointment if necessary.  Additionally, you may be receiving a survey about your experience at our office within a few days to 1 week by e-mail or mail. We value your feedback.  Marsa Officer, DO [facility], [place]

## 2024-08-12 NOTE — Progress Notes (Signed)
 Subjective:    Patient ID: Vanessa Dalton, female    DOB: 1986-04-30, 38 y.o.   MRN: [id number]  DAYSIA VANDENBOOM is a 38 y.o. female presenting on 08/12/2024 for Fibromyalgia (Chronic myofascial pain)   Virtual / Telehealth Encounter - Video Visit via MyChart The purpose of this virtual visit is to provide medical care while limiting exposure to the novel coronavirus (COVID19) for both patient and office staff.  Consent was obtained for remote visit:  Yes.   Answered questions that patient had about telehealth interaction:  Yes.   I discussed the limitations, risks, security and privacy concerns of performing an evaluation and management service by video/telephone. I also discussed with the patient that there may be a patient responsible charge related to this service. The patient expressed understanding and agreed to proceed.  Patient Location: Work Provider Location: [employer] (Office)  Participants in virtual visit: - Patient: Vanessa Dalton - CMA: Alan Fontana CMA - Provider: Dr Edman   HPI  Discussed the use of AI scribe software for clinical note transcription with the patient, who gave verbal consent to proceed.  History of Present Illness   Vanessa Dalton is a 38 year old female with fibromyalgia who presents for a new medication request for chronic joint and muscle pain.  Fibromyalgia / Chronic musculoskeletal / Myofascial Pain - Chronic joint and muscle pain with a prior diagnosis of fibromyalgia from Rheumatologist. We have discussed her past history with chronic pain affecting multiple joints and connective tissues and has some neuropathic sharp episodic pain symptoms. - Pain described as almost shooting and debilitating, previously affecting elbows and now localized to the hip - Pain is neuropathic in nature and occurs in waves  Established w [facility] 07/20/24 Dr Vanessa Dalton  Abnormal autoimmune laboratory findings - Positive antinuclear  antibody screening - Low C3 and C4 complement levels - Other abnormal blood results under evaluation by rheumatology; etiology not yet determined  Adverse effects from neuropathic pain medication - Prior trial of gabapentin resulted in significant cognitive side effects, including brain fog and impaired clarity of thinking - Seeking alternative medications for improved pain control     Diagnosed with Fibromyalgia with nerve pain  - Continues on Duloxetine  60 mg daily provides relief for anxiety and joint pain       06/22/2024    9:38 AM  Depression screen PHQ 2/9  Decreased Interest 0  Down, Depressed, Hopeless 0  PHQ - 2 Score 0  Altered sleeping 1  Tired, decreased energy 2  Change in appetite 0  Feeling bad or failure about yourself  0  Trouble concentrating 0  Moving slowly or fidgety/restless 0  Suicidal thoughts 0  PHQ-9 Score 3   Difficult doing work/chores Somewhat difficult     Data saved with a previous flowsheet row definition       06/22/2024    9:38 AM  GAD 7 : Generalized Anxiety Score  Nervous, Anxious, on Edge 1  Control/stop worrying 0  Worry too much - different things 0  Trouble relaxing 0  Restless 0  Easily annoyed or irritable 0  Afraid - awful might happen 0  Total GAD 7 Score 1  Anxiety Difficulty Not difficult at all    Social History   Tobacco Use   Smoking status: Never    Passive exposure: Never   Smokeless tobacco: Never  Vaping Use   Vaping status: Never Used  Substance Use Topics   Alcohol use:  No   Drug use: Never    Review of Systems Per HPI unless specifically indicated above     Objective:    LMP 07/07/2024 (Exact Date)   Wt Readings from Last 3 Encounters:  07/20/24 138 lb 9.6 oz (62.9 kg)  07/20/24 136 lb (61.7 kg)  06/22/24 138 lb 3.2 oz (62.7 kg)     Physical Exam  Note examination was completely remotely via video observation objective data only  Gen - well-appearing, no acute distress or apparent  pain, comfortable HEENT - eyes appear clear without discharge or redness Heart/Lungs - cannot examine virtually - observed no evidence of coughing or labored breathing. Abd - cannot examine virtually  Skin - face visible today- no rash Neuro - awake, alert, oriented Psych - not anxious appearing   Results for orders placed or performed in visit on 07/29/24  Complement component c1q   Collection Time: 07/29/24  1:42 PM  Result Value Ref Range   Complement C1Q 5.0 5.0 - 8.6 mg/dL  Complement, total   Collection Time: 07/29/24  1:42 PM  Result Value Ref Range   Compl, Total (CH50) 37 31 - 60 U/mL  IgG, IgA, IgM   Collection Time: 07/29/24  1:42 PM  Result Value Ref Range   Immunoglobulin A 152 47 - 310 mg/dL   IgG (Immunoglobin G), Serum 1,163 600 - 1,640 mg/dL   IgM, Serum 51 50 - 699 mg/dL      Assessment & Plan:   Problem List Items Addressed This Visit     Chronic myofascial pain   Relevant Medications   pregabalin  (LYRICA ) 50 MG capsule   Other Visit Diagnoses       Fibromyalgia    -  Primary   Relevant Medications   pregabalin  (LYRICA ) 50 MG capsule        Fibromyalgia and chronic joint and muscle pain Chronic pain consistent with fibromyalgia. Positive ANA, low C3, C4. Rheumatologist consulted. Lyrica  recommended for nerve pain management due to previous gabapentin side effects.  Today discussed as requested by Rheumatology to start Lyrica . I agree that this option would address her pain concerns  - Prescribed Lyrica  50 mg once daily for the first week, then 50 mg twice daily. - Start Lyrica  after cruise to avoid dizziness during travel w/ possible side effects. Monitor for dizziness and headache. - Adjust dosage based on tolerance and effectiveness. - Follow up with rheumatologist for abnormal labs.      If minor dose adjust is needed, she can message back within 30 days, if we need to do significant change or switch or add meds, we can follow-up  again.   No orders of the defined types were placed in this encounter.   Meds ordered this encounter  Medications   pregabalin  (LYRICA ) 50 MG capsule    Sig: Take 1 capsule (50 mg total) by mouth 2 (two) times daily.    Dispense:  60 capsule    Refill:  2    Follow up plan: Return if symptoms worsen or fail to improve.  CC Chart to Dr Vanessa Dalton   Patient verbalizes understanding with the above medical recommendations including the limitation of remote medical advice.  Specific follow-up and call-back criteria were given for patient to follow-up or seek medical care more urgently if needed.  Total duration of direct patient care provided via video conference: 12 minutes   Marsa Officer, DO [facility] 08/12/2024, 10:01 AM

## 2024-08-13 ENCOUNTER — Telehealth: Payer: Self-pay

## 2024-08-13 DIAGNOSIS — B370 Candidal stomatitis: Secondary | ICD-10-CM

## 2024-08-13 MED ORDER — FLUCONAZOLE 200 MG PO TABS: 200.0000 mg | ORAL_TABLET | Freq: Every day | ORAL | 0 refills | Status: AC

## 2024-08-13 NOTE — Telephone Encounter (Signed)
 Patient called to follow up on message sent yesterday.  She completed a z-pack for a sinus infection a few weeks ago and developed a yeast infection.  She completed 2 weeks of boric acid and took one diflucan.  Vaginal symptoms improved but did not entirely resolve. Vaginal itching has returned along with thick white, creamy patches on her tongue and a foul taste in her mouth.  Per Lydia's order (see 08/11/24 documentation) Diflucan 200mg  po x 14 days sent to [facility].

## 2024-08-14 NOTE — Addendum Note (Signed)
 Addended by: LUBA STABS A on: 08/14/2024 09:31 AM   Modules accepted: Orders

## 2024-09-14 ENCOUNTER — Telehealth

## 2024-09-14 DIAGNOSIS — B379 Candidiasis, unspecified: Secondary | ICD-10-CM

## 2024-09-15 NOTE — Progress Notes (Signed)
" °  Because of having a form of yeast that is very resistant and failing treatments, I feel your condition warrants further evaluation and I recommend that you be seen in a face-to-face visit.   NOTE: There will be NO CHARGE for this E-Visit   If you are having a true medical emergency, please call 911.     For an urgent face to face visit, [place] has multiple urgent care centers for your convenience.  Click the link below for the full list of locations and hours, walk-in wait times, appointment scheduling options and driving directions:  Urgent Care - [place], [place], [place], [place], [place], [place]  [place]     Your MyChart E-visit questionnaire answers were reviewed by a board certified advanced clinical practitioner to complete your personal care plan based on your specific symptoms.    Thank you for using e-Visits.    "

## 2024-10-14 ENCOUNTER — Telehealth: Admitting: Emergency Medicine

## 2024-10-14 DIAGNOSIS — B9689 Other specified bacterial agents as the cause of diseases classified elsewhere: Secondary | ICD-10-CM

## 2024-10-14 DIAGNOSIS — J019 Acute sinusitis, unspecified: Secondary | ICD-10-CM | POA: Diagnosis not present

## 2024-10-14 MED ORDER — AMOXICILLIN-POT CLAVULANATE 875-125 MG PO TABS: 1.0000 | ORAL_TABLET | Freq: Two times a day (BID) | ORAL | 0 refills | Status: AC

## 2024-10-14 NOTE — Progress Notes (Unsigned)
" ° ° °  NURSE VISIT NOTE  Subjective:    Patient ID: Vanessa Dalton, female    DOB: 1985-12-14, 39 y.o.   MRN: [id number]  HPI  Patient is a 39 y.o. G49P2002 female who presents for {pe vag discharge desc:315065} vaginal discharge for *** {gen duration:315003}. Denies abnormal vaginal bleeding or significant pelvic pain or fever. {Actions; denies/reports/admits to:19208} {UTI Symptoms:210800002}. Patient {has/denies:33800} history of known exposure to STD.   Objective:    There were no vitals taken for this visit.   No results found for any visits on 10/15/24.  Assessment:   No diagnosis found.  {vaginitis type:315262}  Plan:   GC and chlamydia DNA  probe sent to lab. Treatment: {vaginitis tx:315263} ROV prn if symptoms persist or worsen.   Burnard LITTIE Ro, CMA  "

## 2024-10-14 NOTE — Progress Notes (Signed)
 E-Visit for Sinus Problems  We are sorry that you are not feeling well.  Here is how we plan to help!  Based on what you have shared with me it looks like you have sinusitis.  Sinusitis is inflammation and infection in the sinus cavities of the head.  Based on your presentation I believe you most likely have Acute Bacterial Sinusitis.  This is an infection caused by bacteria and is treated with antibiotics. I have prescribed Augmentin  875mg /125mg  one tablet twice daily with food, for 7 days.   You may use an oral decongestant such as Mucinex D or if you have glaucoma or high blood pressure use plain Mucinex. Saline nasal spray help and can safely be used as often as needed for congestion. Try using saline irrigation, such as with a neti pot, several times a day while you are sick. Many neti pots come with salt packets premeasured to use to make saline. If you use your own salt, make sure it is kosher salt or sea salt (don't use table salt as it has iodine in it and you don't need that in your nose). Use distilled water to make saline. If you mix your own saline using your own salt, the recipe is 1/4 teaspoon salt in 1 cup warm water. Using saline irrigation can help prevent and treat sinus infections.   If you develop worsening sinus pain, fever or notice severe headache and vision changes, or if symptoms are not better after completion of antibiotic, please schedule an appointment with a health care provider.    Sinus infections are not as easily transmitted as other respiratory infection, however we still recommend that you avoid close contact with loved ones, especially the very young and elderly.  Remember to wash your hands thoroughly throughout the day as this is the number one way to prevent the spread of infection!  Home Care: Only take medications as instructed by your medical team. Complete the entire course of an antibiotic. Do not take these medications with alcohol. A steam or  ultrasonic humidifier can help congestion.  You can place a towel over your head and breathe in the steam from hot water coming from a faucet. Avoid close contacts especially the very young and the elderly. Cover your mouth when you cough or sneeze. Always remember to wash your hands.  Get Help Right Away If: You develop worsening fever or sinus pain. You develop a severe head ache or visual changes. Your symptoms persist after you have completed your treatment plan.  Make sure you Understand these instructions. Will watch your condition. Will get help right away if you are not doing well or get worse.  Your e-visit answers were reviewed by a board certified advanced clinical practitioner to complete your personal care plan.  Depending on the condition, your plan could have included both over the counter or prescription medications.  If there is a problem please reply  once you have received a response from your provider.  Your safety is important to us .  If you have drug allergies check your prescription carefully.    You can use MyChart to ask questions about today's visit, request a non-urgent call back, or ask for a work or school excuse for 24 hours related to this e-Visit. If it has been greater than 24 hours you will need to follow up with your provider, or enter a new e-Visit to address those concerns.  You will get an e-mail in the next two days asking  about your experience.  I hope that your e-visit has been valuable and will speed your recovery. Thank you for using e-visits.  I have spent 5 minutes in review of e-visit questionnaire, review and updating patient chart, medical decision making and response to patient.   Jon Belt, PhD, FNP-BC

## 2024-10-15 ENCOUNTER — Ambulatory Visit

## 2024-10-15 VITALS — BP 92/66 | HR 80 | Ht 63.0 in | Wt 139.4 lb

## 2024-10-15 DIAGNOSIS — N898 Other specified noninflammatory disorders of vagina: Secondary | ICD-10-CM

## 2024-10-17 LAB — NUSWAB VAGINITIS PLUS (VG+)
Candida albicans, NAA: NEGATIVE
Candida glabrata, NAA: POSITIVE — AB
Chlamydia trachomatis, NAA: NEGATIVE
Neisseria gonorrhoeae, NAA: NEGATIVE
Trich vag by NAA: NEGATIVE

## 2024-10-21 ENCOUNTER — Other Ambulatory Visit: Payer: Self-pay | Admitting: Licensed Practical Nurse

## 2024-10-21 ENCOUNTER — Ambulatory Visit: Payer: Self-pay | Admitting: Licensed Practical Nurse

## 2024-10-21 NOTE — Progress Notes (Signed)
 Pt seen for vaginal complaints, swab shows Candida Glabrata, has previously used Boric Acid with no relief. Multiple compounding pharmacies called for flucytosine-unable to be located. Per open evidence Nystatin Vaginal suppository recommended.   Call to [facility] 100,000 vaginal suppository insert daily times 14 days ordered Mychart message sent to pt.  Jinnie Cookey, CNM  [facility] 10/21/24  11:53 AM

## 2024-11-09 ENCOUNTER — Ambulatory Visit: Admitting: Family Medicine

## 2025-01-18 ENCOUNTER — Ambulatory Visit: Admitting: Neurology

## 2025-06-22 ENCOUNTER — Other Ambulatory Visit

## 2025-06-29 ENCOUNTER — Encounter: Admitting: Family Medicine
